# Patient Record
Sex: Male | Born: 1970 | Race: White | Hispanic: No | Marital: Single | Smoking: Current some day smoker
Health system: Southern US, Community
[De-identification: ages and names within clinical notes are randomized; demographics above are authoritative.]

## PROBLEM LIST (undated history)

## (undated) DIAGNOSIS — F329 Major depressive disorder, single episode, unspecified: Secondary | ICD-10-CM

## (undated) DIAGNOSIS — F32A Depression, unspecified: Secondary | ICD-10-CM

## (undated) DIAGNOSIS — F1010 Alcohol abuse, uncomplicated: Secondary | ICD-10-CM

## (undated) DIAGNOSIS — F1910 Other psychoactive substance abuse, uncomplicated: Secondary | ICD-10-CM

## (undated) DIAGNOSIS — F319 Bipolar disorder, unspecified: Secondary | ICD-10-CM

---

## 2013-04-22 ENCOUNTER — Encounter (HOSPITAL_COMMUNITY): Payer: Self-pay | Admitting: *Deleted

## 2013-04-22 DIAGNOSIS — F319 Bipolar disorder, unspecified: Secondary | ICD-10-CM | POA: Insufficient documentation

## 2013-04-22 DIAGNOSIS — F17200 Nicotine dependence, unspecified, uncomplicated: Secondary | ICD-10-CM | POA: Insufficient documentation

## 2013-04-22 DIAGNOSIS — Z79899 Other long term (current) drug therapy: Secondary | ICD-10-CM | POA: Insufficient documentation

## 2013-04-22 DIAGNOSIS — F1910 Other psychoactive substance abuse, uncomplicated: Secondary | ICD-10-CM | POA: Insufficient documentation

## 2013-04-22 NOTE — ED Notes (Addendum)
Homeless, off psych meds for 4 days, feeling SI without plan to harm self. Pt calm in triage. PT states he went to [facility] on Friday, it was full, camped out, pt describes more depression. Pt admits to taking Aderall from someone. Pt rambles with story

## 2013-04-23 ENCOUNTER — Encounter (HOSPITAL_COMMUNITY): Payer: Self-pay

## 2013-04-23 ENCOUNTER — Emergency Department (HOSPITAL_COMMUNITY)
Admission: EM | Admit: 2013-04-23 | Discharge: 2013-04-23 | Disposition: A | Discharge status: Other Acute Inpatient Hospital | Payer: No Typology Code available for payment source | Attending: Emergency Medicine | Admitting: Emergency Medicine

## 2013-04-23 ENCOUNTER — Encounter (HOSPITAL_COMMUNITY): Payer: Self-pay | Admitting: Medical

## 2013-04-23 ENCOUNTER — Inpatient Hospital Stay (HOSPITAL_COMMUNITY)
Admission: AD | Admit: 2013-04-23 | Discharge: 2013-04-26 | DRG: 885 | Disposition: A | Discharge status: Home / Self Care | Payer: No Typology Code available for payment source | Source: Intra-hospital | Attending: Psychiatry | Admitting: Psychiatry

## 2013-04-23 DIAGNOSIS — Z5987 Material hardship due to limited financial resources, not elsewhere classified: Secondary | ICD-10-CM

## 2013-04-23 DIAGNOSIS — F1410 Cocaine abuse, uncomplicated: Secondary | ICD-10-CM | POA: Diagnosis present

## 2013-04-23 DIAGNOSIS — F3160 Bipolar disorder, current episode mixed, unspecified: Secondary | ICD-10-CM

## 2013-04-23 DIAGNOSIS — F319 Bipolar disorder, unspecified: Principal | ICD-10-CM | POA: Diagnosis present

## 2013-04-23 DIAGNOSIS — F1910 Other psychoactive substance abuse, uncomplicated: Secondary | ICD-10-CM

## 2013-04-23 DIAGNOSIS — Z5900 Homelessness unspecified: Secondary | ICD-10-CM

## 2013-04-23 DIAGNOSIS — Z598 Other problems related to housing and economic circumstances: Secondary | ICD-10-CM

## 2013-04-23 DIAGNOSIS — F3162 Bipolar disorder, current episode mixed, moderate: Secondary | ICD-10-CM | POA: Diagnosis present

## 2013-04-23 DIAGNOSIS — Z91199 Patient's noncompliance with other medical treatment and regimen due to unspecified reason: Secondary | ICD-10-CM

## 2013-04-23 DIAGNOSIS — Z9119 Patient's noncompliance with other medical treatment and regimen: Secondary | ICD-10-CM

## 2013-04-23 DIAGNOSIS — R45851 Suicidal ideations: Secondary | ICD-10-CM

## 2013-04-23 HISTORY — DX: Depression, unspecified: F32.A

## 2013-04-23 HISTORY — DX: Bipolar disorder, unspecified: F31.9

## 2013-04-23 HISTORY — DX: Major depressive disorder, single episode, unspecified: F32.9

## 2013-04-23 LAB — CBC WITH DIFFERENTIAL/PLATELET
Basophils Relative: 0 % (ref 0–1)
Eosinophils Absolute: 0.1 10*3/uL (ref 0.0–0.7)
Eosinophils Relative: 1 % (ref 0–5)
MCH: 30.4 pg (ref 26.0–34.0)
MCHC: 34.9 g/dL (ref 30.0–36.0)
Neutrophils Relative %: 62 % (ref 43–77)
Platelets: 246 10*3/uL (ref 150–400)

## 2013-04-23 LAB — ETHANOL: Alcohol, Ethyl (B): <11 mg/dL (ref 0–11)

## 2013-04-23 LAB — RAPID URINE DRUG SCREEN, HOSP PERFORMED
Opiates: NOT DETECTED
Tetrahydrocannabinol: POSITIVE — AB

## 2013-04-23 MED ORDER — MAGNESIUM HYDROXIDE 400 MG/5ML PO SUSP: 30.0000 mL | Freq: Every day | ORAL | Status: DC | PRN

## 2013-04-23 MED ORDER — ACETAMINOPHEN 325 MG PO TABS: 650.0000 mg | ORAL_TABLET | Freq: Four times a day (QID) | ORAL | Status: DC | PRN

## 2013-04-23 MED ORDER — ALUM & MAG HYDROXIDE-SIMETH 200-200-20 MG/5ML PO SUSP: 30.0000 mL | ORAL | Status: DC | PRN

## 2013-04-23 MED ORDER — HYDROXYZINE HCL 50 MG PO TABS: 50.0000 mg | ORAL_TABLET | Freq: Every evening | ORAL | Status: DC | PRN

## 2013-04-23 MED ORDER — PNEUMOCOCCAL VAC POLYVALENT 25 MCG/0.5ML IJ INJ
0.5000 mL | INJECTION | INTRAMUSCULAR | Status: AC
  Administered 2013-04-24: 0.5 mL via INTRAMUSCULAR

## 2013-04-23 MED ORDER — HYDROXYZINE HCL 50 MG PO TABS
50.0000 mg | ORAL_TABLET | Freq: Every evening | ORAL | Status: DC | PRN
  Administered 2013-04-23 – 2013-04-25 (×3): 50 mg via ORAL
  Filled 2013-04-23 (×2): qty 1
  Filled 2013-04-23: qty 28
  Filled 2013-04-23: qty 1
  Filled 2013-04-23: qty 28
  Filled 2013-04-23 (×6): qty 1

## 2013-04-23 NOTE — BH Assessment (Signed)
Assessment Note   Cody Osborne is an 42 y.o. male. Pt presents voluntarily to [facility] with SI and having been off his psych meds since his arrival from [facility] to [place] one week ago. Pt has been inpatient previously: [employer], [facility], [address], in [place], [facility] - for bipolar disorder and crack cocaine abuse. Pt states he hasn't used crack since Feb 2014. He was taking Effexor 150 mg. Pt reports risky behavior including selling his vehicles for drugs, quitting jobs suddenly.  Pt states he thinks about killing himself by drowning like his grandmother did in 51. Pt unable to contract for safety.  Pt cooperative, hyperverbal. He denies [facility] and denies HI. No delusions noted.  He reports depressed mood with irritability.   Axis I: Bipolar I Disorder Axis II: Deferred Axis III:  Past Medical History  Diagnosis Date  . Bipolar 1 disorder   . Depression    Axis IV: economic problems, housing problems, occupational problems, other psychosocial or environmental problems, problems related to social environment and problems with primary support group Axis V: 31-40 impairment in reality testing  Past Medical History:  Past Medical History  Diagnosis Date  . Bipolar 1 disorder   . Depression     History reviewed. No pertinent past surgical history.  Family History: No family history on file.  Social History:  reports that he has been smoking.  He does not have any smokeless tobacco history on file. He reports that  drinks alcohol. He reports that he uses illicit drugs (Marijuana).  Additional Social History:  Alcohol / Drug Use Pain Medications: none Prescriptions: see PTA meds - hasn't taken Effexor in one week Over the Counter: none History of alcohol / drug use?: Yes Negative Consequences of Use: Financial;Work / School Substance #1 Name of Substance 1: crack cocaine 1 - Amount (size/oz): varied depending on what he could afford 1 - Frequency: varied 1 -  Duration: on and off for years 1 - Last Use / Amount: Feb 2014  CIWA: CIWA-Ar BP: 153/114 mmHg Pulse Rate: 102 COWS:    Allergies: No Known Allergies  Home Medications:  (Not in a hospital admission)  OB/GYN Status:  No LMP for male patient.  General Assessment Data Location of Assessment: [facility] Living Arrangements: Other (Comment) (homeless) Can pt return to current living arrangement?: Yes Admission Status: Voluntary Is patient capable of signing voluntary admission?: Yes Transfer from: Acute Hospital Referral Source: Self/Family/Friend  Education Status Is patient currently in school?: No  Risk to self Suicidal Ideation: Yes-Currently Present Suicidal Intent: No Is patient at risk for suicide?: Yes Suicidal Plan?:  (think of drowning himself) Access to Means: Yes Specify Access to Suicidal Means: bodies of water What has been your use of drugs/alcohol within the last 12 months?: occasional crack use Previous Attempts/Gestures: No How many times?: 0 Other Self Harm Risks: none Triggers for Past Attempts:  (n/a) Intentional Self Injurious Behavior: None Family Suicide History: Yes (grandmother drownedherself in 58) Recent stressful life event(s): Other (Comment) (homelessness, moved from [place]) Persecutory voices/beliefs?: No Depression: No Depression Symptoms: Feeling angry/irritable Substance abuse history and/or treatment for substance abuse?: Yes Suicide prevention information given to non-admitted patients: Not applicable  Risk to Others Homicidal Ideation: No Thoughts of Harm to Others: No Current Homicidal Intent: No Current Homicidal Plan: No Access to Homicidal Means: No Identified Victim: none History of harm to others?: No Assessment of Violence: None Noted Violent Behavior Description: pt calm  Does patient have access to weapons?:  No Criminal Charges Pending?: No Does patient have a court date: No  Psychosis Hallucinations: None  noted Delusions: None noted  Mental Status Report Appear/Hygiene: Other (Comment) (approrpiate) Eye Contact: Good Motor Activity: Freedom of movement Speech: Logical/coherent;Rapid Level of Consciousness: Alert Mood: Depressed Affect: Appropriate to circumstance;Depressed Anxiety Level: None Thought Processes: Coherent;Relevant Judgement: Unimpaired Orientation: Person;Place;Time;Situation Obsessive Compulsive Thoughts/Behaviors: None  Cognitive Functioning Concentration: Normal Memory: Recent Intact;Remote Intact IQ: Average Insight: Fair Impulse Control: Poor Appetite: Good Weight Loss: 0 Weight Gain: 0 Sleep: Decreased Total Hours of Sleep: 4 (homeless so difficult to sleep outside) Vegetative Symptoms: None  ADLScreening [facility] Assessment Services) Patient's cognitive ability adequate to safely complete daily activities?: Yes Patient able to express need for assistance with ADLs?: Yes Independently performs ADLs?: Yes (appropriate for developmental age)  Abuse/Neglect [facility] Physical Abuse: Denies Verbal Abuse: Denies Sexual Abuse: Denies  Prior Inpatient Therapy Prior Inpatient Therapy: Yes Prior Therapy Dates: over several years Prior Therapy Facilty/Provider(s): in [facility], [facility], [facility], in [facility], [facility] Reason for Treatment: bipolar disorder and crack cocaine abuse  Prior Outpatient Therapy Prior Outpatient Therapy: Yes Prior Therapy Dates: until last week Prior Therapy Facilty/Provider(s): at [facility] Reason for Treatment: med management  ADL Screening (condition at time of admission) Patient's cognitive ability adequate to safely complete daily activities?: Yes Is the patient deaf or have difficulty hearing?: No Does the patient have difficulty seeing, even when wearing glasses/contacts?: No Does the patient have difficulty concentrating, remembering, or making decisions?: No Patient able to express need for assistance with  ADLs?: Yes Does the patient have difficulty dressing or bathing?: No Independently performs ADLs?: Yes (appropriate for developmental age) Does the patient have difficulty walking or climbing stairs?: No Weakness of Legs: None Weakness of Arms/Hands: None       Abuse/Neglect Assessment (Assessment to be complete while patient is alone) Physical Abuse: Denies Verbal Abuse: Denies Sexual Abuse: Denies Exploitation of patient/patient's resources: Denies Self-Neglect: Denies Values / Beliefs Cultural Requests During Hospitalization: None Spiritual Requests During Hospitalization: None   Advance Directives (For Healthcare) Advance Directive: Patient does not have advance directive;Patient would not like information    Additional Information 1:1 In Past 12 Months?: No CIRT Risk: No Elopement Risk: No Does patient have medical clearance?: Yes     Disposition:  Disposition Initial Assessment Completed for this Encounter: Yes Disposition of Patient: Inpatient treatment program;Outpatient treatment  On Site Evaluation by:   Reviewed with Physician:     Donnamarie Rossetti P 04/23/2013 4:08 AM

## 2013-04-23 NOTE — Progress Notes (Signed)
Patient ID: Cody Osborne, male   DOB: [birth year], 42 y.o.   MRN: [id number]  Admission Note:  D:42 yr male who presents [facility] in no acute distress for the treatment of SI and Depression. Pt appears flat and depressed. Pt was calm and cooperative with admission process.Pt denies SI/HI/AVH/Pain at this time. Pt states he was on way to [facility] in [place], but it was full, so pt stayed in tents with friend, but pt was feeling suicidal and having increases depression, so pt was brought to [facility].  A:Skin was assessed and found to be clear of any abnormal marks apart from tattoos L-shoulder, R-leg, both arms. POC and unit policies explained and understanding verbalized. Consents obtained. Food and fluids offered, and Accepted.  R: Pt had no additional questions or concerns at this time.

## 2013-04-23 NOTE — Consult Note (Signed)
Reason for Consult:ED admission for Bipolar do off medications-left previous treatment/support system in / [facility] 5 days ago without meds.Staying in woods with other homeless people one of whom provide him with adderall past 5 days.Says if he has to live out in woods he "will have to do something drastic" in response to question about suicide Referring Physician: ED Providers  Cody Osborne is an 42 y.o. male.  HPI: see ED admit note and Reason For Consultation  Past Medical History  Diagnosis Date  . Bipolar 1 disorder   . Depression     History reviewed. No pertinent past surgical history.  No family history on file.  Social History:  reports that he has been smoking.  He does not have any smokeless tobacco history on file. He reports that  drinks alcohol. He reports that he uses illicit drugs (Marijuana).  Allergies: No Known Allergies  Medications: I have reviewed the patient's current medications.  Results for orders placed during the hospital encounter of 04/23/13 (from the past 48 hour(s))  URINE RAPID DRUG SCREEN (HOSP PERFORMED)     Status: Abnormal   Collection Time    04/23/13 12:40 AM      Result Value Range   Opiates NONE DETECTED  NONE DETECTED   Cocaine NONE DETECTED  NONE DETECTED   Benzodiazepines NONE DETECTED  NONE DETECTED   Amphetamines POSITIVE (*) NONE DETECTED   Tetrahydrocannabinol POSITIVE (*) NONE DETECTED   Barbiturates NONE DETECTED  NONE DETECTED   Comment:            DRUG SCREEN FOR MEDICAL PURPOSES     ONLY.  IF CONFIRMATION IS NEEDED     FOR ANY PURPOSE, NOTIFY LAB     WITHIN 5 DAYS.                LOWEST DETECTABLE LIMITS     FOR URINE DRUG SCREEN     Drug Class       Cutoff (ng/mL)     Amphetamine      1000     Barbiturate      200     Benzodiazepine   200     Tricyclics       300     Opiates          300     Cocaine          300     THC              50  CBC WITH DIFFERENTIAL     Status: Abnormal   Collection Time   04/23/13  1:05 AM      Result Value Range   WBC 12.6 (*) 4.0 - 10.5 K/uL   RBC 5.26  4.22 - 5.81 MIL/uL   Hemoglobin 16.0  13.0 - 17.0 g/dL   HCT 62.1  30.8 - 65.7 %   MCV 87.3  78.0 - 100.0 fL   MCH 30.4  26.0 - 34.0 pg   MCHC 34.9  30.0 - 36.0 g/dL   RDW 84.6  96.2 - 95.2 %   Platelets 246  150 - 400 K/uL   Neutrophils Relative % 62  43 - 77 %   Neutro Abs 7.8 (*) 1.7 - 7.7 K/uL   Lymphocytes Relative 30  12 - 46 %   Lymphs Abs 3.8  0.7 - 4.0 K/uL   Monocytes Relative 7  3 - 12 %   Monocytes Absolute 0.9  0.1 - 1.0  K/uL   Eosinophils Relative 1  0 - 5 %   Eosinophils Absolute 0.1  0.0 - 0.7 K/uL   Basophils Relative 0  0 - 1 %   Basophils Absolute 0.0  0.0 - 0.1 K/uL  ETHANOL     Status: None   Collection Time    04/23/13  1:05 AM      Result Value Range   Alcohol, Ethyl (B) <11  0 - 11 mg/dL   Comment:            LOWEST DETECTABLE LIMIT FOR     SERUM ALCOHOL IS 11 mg/dL     FOR MEDICAL PURPOSES ONLY    No results found.  Review of Systems  Constitutional: Negative.  Negative for fever, chills and weight loss.  HENT: Negative.  Negative for hearing loss, ear pain, nosebleeds, neck pain, tinnitus and ear discharge.   Eyes: Negative.  Negative for blurred vision, double vision, photophobia, pain, discharge and redness.  Respiratory: Negative.  Negative for cough, hemoptysis, sputum production, shortness of breath and wheezing.   Cardiovascular: Negative.  Negative for chest pain, palpitations, orthopnea, claudication, leg swelling and PND.  Gastrointestinal: Negative.  Negative for heartburn, nausea, vomiting, diarrhea and constipation.  Genitourinary: Negative.  Negative for dysuria, urgency, frequency, hematuria and flank pain.  Musculoskeletal: Negative.  Negative for myalgias, back pain, joint pain and falls.  Skin: Positive for rash. Negative for itching.  Neurological: Negative.  Negative for dizziness, tingling, tremors, sensory change, speech change, focal  weakness, seizures, loss of consciousness and headaches.  Endo/Heme/Allergies: Negative.  Negative for environmental allergies and polydipsia. Does not bruise/bleed easily.  Psychiatric/Behavioral: Positive for depression, suicidal ideas and substance abuse (amphetamines most recently-2 rehabs 2002 and 2009 for crack cocaine). Negative for hallucinations and memory loss. The patient has insomnia. The patient is not nervous/anxious.        Takes effexor 150 mg irregularly   Blood pressure 153/114, pulse 102, temperature 98.3 F (36.8 C), temperature source Oral, resp. rate 20, SpO2 100.00%. Physical Exam  Nursing note and vitals reviewed. Constitutional: He is oriented to person, place, and time. He appears well-developed and well-nourished.  HENT:  Head: Normocephalic and atraumatic.  Right Ear: External ear normal.  Left Ear: External ear normal.  Nose: Nose normal.  Eyes: Conjunctivae and EOM are normal. Pupils are equal, round, and reactive to light. Right eye exhibits no discharge. Left eye exhibits no discharge. No scleral icterus.  Neck: Normal range of motion. Neck supple. No JVD present. No tracheal deviation present.  Cardiovascular: Normal rate and regular rhythm.   Respiratory: Effort normal and breath sounds normal.  GI:  Deferred  Genitourinary:  Deferred  Musculoskeletal: Normal range of motion.  Neurological: He is alert and oriented to person, place, and time. He has normal reflexes.  Psychiatric: His affect is labile. His speech is rapid and/or pressured. He is hyperactive. Thought content is not paranoid and not delusional. He expresses impulsivity and inappropriate judgment. He expresses suicidal ideation. He expresses no homicidal ideation. He expresses suicidal plans (jump in pool and drown). He expresses no homicidal plans. He exhibits abnormal recent memory and abnormal remote memory.    Assessment/Plan  A-    AXIS I-Bipolar mixed moderate episode                     Noncompliant with Meds                    Polysubstance  abuse-THC/Crack Cocaine/Amphetamines                    Nicotene Dependence         AXIS II- Deferred         AXIS III- Elevated WBC-? Etiology         Axis IV- No family/social support sytem ; homeless         Axis V- 40-50  Plan- Admit to [facility] after WBC elevation addressed and treated  Cody Osborne 04/23/2013, 3:57 AM

## 2013-04-23 NOTE — ED Provider Notes (Signed)
Medical screening examination/treatment/procedure(s) were performed by non-physician practitioner and as supervising physician I was immediately available for consultation/collaboration.  Jones Skene, M.D.     Jones Skene, MD 04/23/13 1113

## 2013-04-23 NOTE — ED Notes (Signed)
Placed called to [facility]. They are a shelter only they do not keep medical charts.

## 2013-04-23 NOTE — BHH Counselor (Signed)
Patient accepted to [facility] by Leonette Most, PA to Dr. Elsie Saas. The room assignment is 507-2. The nursing report # is [phone].

## 2013-04-23 NOTE — Progress Notes (Signed)
Patient ID: Cody Osborne, male   DOB: [birth year], 42 y.o.   MRN: [id number]  D: Pt denies SI/HI/AVH/pain. Pt is pleasant and cooperative. Pt states he felt better after he ate earlier.   A: Pt was offered support and encouragement. Pt was given scheduled medications. Pt was encourage to attend groups. Q 15 minute checks were done for safety.   R:Pt attends groups and interacts well with peers and staff. Pt is taking medication. Pt has no complaints at this time.Pt receptive to treatment and safety maintained on unit.

## 2013-04-23 NOTE — ED Notes (Signed)
Pt has a green and yellow book bag and one pt belonging bag. Pt has only clothes/shoes. Pt does not have a phone or any electronic devices with him.

## 2013-04-23 NOTE — Tx Team (Signed)
Initial Interdisciplinary Treatment Plan  PATIENT STRENGTHS: (choose at least two) General fund of knowledge  PATIENT STRESSORS: Financial difficulties Health problems Medication change or noncompliance   PROBLEM LIST: Problem List/Patient Goals Date to be addressed Date deferred Reason deferred Estimated date of resolution  Depression      Risk for Suicide                                                 DISCHARGE CRITERIA:  Adequate post-discharge living arrangements Improved stabilization in mood, thinking, and/or behavior  PRELIMINARY DISCHARGE PLAN: Attend aftercare/continuing care group Outpatient therapy  PATIENT/FAMIILY INVOLVEMENT: This treatment plan has been presented to and reviewed with the patient, Cody Osborne.  The patient and family have been given the opportunity to ask questions and make suggestions.  Jacques Navy A 04/23/2013, 6:06 PM

## 2013-04-23 NOTE — ED Notes (Signed)
Called report to [facility]. Lupita Leash informed me that the bed pt was supposed to go to is still occupied at this time. Lupita Leash said she will call me when an available bed comes open at [facility].

## 2013-04-23 NOTE — ED Notes (Signed)
Personal items and valuable policy reviewed with patient. Patient understands.

## 2013-04-23 NOTE — Progress Notes (Signed)
Adult Psychoeducational Group Note  Date:  04/23/2013 Time:  8:00PM Group Topic/Focus:  Wrap-Up Group:   The focus of this group is to help patients review their daily goal of treatment and discuss progress on daily workbooks.  Participation Level:  Active  Participation Quality:  Appropriate and Attentive  Affect:  Appropriate  Cognitive:  Alert and Appropriate  Insight: Appropriate  Engagement in Group:  Engaged  Modes of Intervention:  Discussion  Additional Comments:  Pt. Was attentive and appropriate during tonight's group discussion. Pt shared with group on how he has been restless and tearful. Pt shared that he have been having thoughts of hurting hisself and that he is now ready for the much needed help. Pt stated that he need to get back on his medication and take better care of hisself.   [name] D 04/23/2013, 9:50 PM

## 2013-04-23 NOTE — ED Provider Notes (Signed)
   History    CSN: [id number] Arrival date & time 04/22/13  2059  First MD Initiated Contact with Patient 04/23/13 0014     Chief Complaint  Patient presents with  . Medical clearance    (Consider location/radiation/quality/duration/timing/severity/associated sxs/prior Treatment) HPI Comments: Patient with Bipolar off meds since Friday (was only on meds for 2 weeks this time) now homeless, scattered thought racing, putting gun to head, or swimming into a hole but than states he would not really do that.   The history is provided by the patient.   Past Medical History  Diagnosis Date  . Bipolar 1 disorder   . Depression    History reviewed. No pertinent past surgical history. No family history on file. History  Substance Use Topics  . Smoking status: Current Every Day Smoker  . Smokeless tobacco: Not on file  . Alcohol Use: Yes     Comment: occ    Review of Systems  Constitutional: Negative for fever and chills.  Respiratory: Negative for shortness of breath.   Cardiovascular: Negative for chest pain and leg swelling.  Genitourinary: Negative for dysuria.  Musculoskeletal: Negative for back pain and gait problem.  Skin: Negative for rash.  Neurological: Negative for dizziness and headaches.  All other systems reviewed and are negative.    Allergies  Review of patient's allergies indicates no known allergies.  Home Medications   Current Outpatient Rx  Name  Route  Sig  Dispense  Refill  . venlafaxine XR (EFFEXOR-XR) 150 MG 24 hr capsule   Oral   Take 150 mg by mouth daily.          BP 153/114  Pulse 102  Temp(Src) 98.3 F (36.8 C) (Oral)  Resp 20  SpO2 100% Physical Exam  Constitutional: He appears well-developed and well-nourished.  HENT:  Head: Atraumatic.  Eyes: Pupils are equal, round, and reactive to light.  Neck: Normal range of motion.  Cardiovascular: Normal rate and regular rhythm.   Pulmonary/Chest: Effort normal.  Abdominal: Soft.   Musculoskeletal: Normal range of motion.  Neurological: He is alert.  Skin: Skin is warm and dry. No rash noted. No erythema.  Psychiatric: His affect is labile. His speech is tangential. He is agitated. Cognition and memory are normal. He expresses impulsivity.  Patient will be speaking normally, although deep tangentially, and then suddenly became hostile, beating his head on the bed.  Over-shoulder, pointing his finger at his head in a burn-like fashion and pulling the trigger in stating, I would never do that    ED Course  Procedures (including critical care time) Labs Reviewed  CBC WITH DIFFERENTIAL - Abnormal; Notable for the following:    WBC 12.6 (*)    Neutro Abs 7.8 (*)    All other components within normal limits  URINE RAPID DRUG SCREEN (HOSP PERFORMED) - Abnormal; Notable for the following:    Amphetamines POSITIVE (*)    Tetrahydrocannabinol POSITIVE (*)    All other components within normal limits  ETHANOL   No results found. No diagnosis found.  MDM  Patient is scattered can't keep a trend of thought.  Orlene Erm is all over the place Patient's labs reviewed.  He is cleared for psych evaluation   Arman Filter, NP 04/23/13 [phone]

## 2013-04-24 ENCOUNTER — Encounter (HOSPITAL_COMMUNITY): Payer: Self-pay | Admitting: Psychiatry

## 2013-04-24 MED ORDER — RISPERIDONE 0.5 MG PO TABS
0.5000 mg | ORAL_TABLET | Freq: Every day | ORAL | Status: DC
  Administered 2013-04-24 – 2013-04-25 (×2): 0.5 mg via ORAL
  Filled 2013-04-24 (×4): qty 1

## 2013-04-24 MED ORDER — VENLAFAXINE HCL 37.5 MG PO TABS
37.5000 mg | ORAL_TABLET | Freq: Every day | ORAL | Status: DC
  Administered 2013-04-25 – 2013-04-26 (×2): 37.5 mg via ORAL
  Filled 2013-04-24 (×4): qty 1

## 2013-04-24 MED ORDER — NICOTINE 14 MG/24HR TD PT24
14.0000 mg | MEDICATED_PATCH | Freq: Every day | TRANSDERMAL | Status: DC
  Administered 2013-04-24 – 2013-04-26 (×3): 14 mg via TRANSDERMAL
  Filled 2013-04-24 (×5): qty 1

## 2013-04-24 MED ORDER — VENLAFAXINE HCL 75 MG PO TABS: 75.0000 mg | ORAL_TABLET | Freq: Every day | ORAL | Status: DC

## 2013-04-24 NOTE — H&P (Signed)
Psychiatric Admission Assessment Adult  Patient Identification:  Cody Osborne Date of Evaluation:  04/24/2013 Chief Complaint:  BIPOLAR I DISORDER POLYSUBSTANCE ABUSE History of Present Illness: Cody Osborne is an 42 y.o. male. Pt presents voluntarily to [facility] with SI and having been off his psych meds since his arrival from [facility] to [place] one week ago. Pt has been inpatient previously: [employer], [facility], [address], in [place], [employer] - for bipolar disorder and crack cocaine abuse. Pt states he hasn't used crack since Feb 2014. Today patient states "I've been abusing substances for so long it's been hard for me to get an accurate mental health diagnosis." Patient talks about being homeless since July 2012 because "When I drink and use drugs I call out from work and stuff so I end up losing jobs. I've been all around at shelters, staying with friends, hanging with people who stay in tents. My family is old and don't care about me. I might as well put a bullet in my brain or drown myself like my grandmother did." He reports not having taken Effexor since 2000. The patient talks about wanting to get a vehicle and a stable living situation stating "I have filled out the paperwork for [employer]. I'm 42 years old and I need to get it together." He denies any legal charges or drinking daily.   Elements:  Location:  [facility] in-patient. Quality:  Depression with SI. Severity:  Severe. Timing:  Last few months. Duration:  Since 2012. Context:  medication noncompliance, homelessness. Associated Signs/Synptoms: Depression Symptoms:  depressed mood, anhedonia, feelings of worthlessness/guilt, difficulty concentrating, hopelessness, suicidal thoughts with specific plan, anxiety, loss of energy/fatigue, disturbed sleep, (Hypo) Manic Symptoms:  Distractibility, Flight of Ideas, Impulsivity, Labiality of Mood, Anxiety Symptoms:  Excessive Worry, Psychotic Symptoms:   Denies PTSD Symptoms: Denies  Psychiatric Specialty Exam: Physical Exam-Findings from the ED reviewed  Review of Systems  Constitutional: Negative.  Negative for fever, chills, weight loss and malaise/fatigue.  HENT: Negative.  Negative for hearing loss, ear pain, nosebleeds, neck pain and ear discharge.   Eyes: Negative.  Negative for blurred vision, double vision and photophobia.  Respiratory: Negative.  Negative for cough, hemoptysis and sputum production.   Cardiovascular: Negative.  Negative for chest pain, palpitations, orthopnea, claudication and leg swelling.  Gastrointestinal: Negative.  Negative for heartburn, nausea, vomiting, abdominal pain and diarrhea.  Genitourinary: Negative.  Negative for dysuria and urgency.  Musculoskeletal: Negative.  Negative for myalgias and joint pain.  Skin: Negative.  Negative for itching and rash.  Neurological: Negative.  Negative for dizziness, tingling, tremors, sensory change and headaches.  Endo/Heme/Allergies: Negative.  Negative for environmental allergies. Does not bruise/bleed easily.  Psychiatric/Behavioral: Positive for depression, suicidal ideas and substance abuse. Negative for hallucinations and memory loss. The patient is nervous/anxious and has insomnia.     Blood pressure 114/77, pulse 80, temperature 97.9 F (36.6 C), temperature source Oral, resp. rate 20, height 5' 9.5" (1.765 m), weight 103.874 kg (229 lb).Body mass index is 33.34 kg/(m^2).  General Appearance: Casual and Disheveled  Eye Contact::  Good  Speech:  Rapid  Volume:  Normal  Mood:  Anxious, Dysphoric and Hopeless  Affect:  Congruent  Thought Process:  Tangential  Orientation:  Full (Time, Place, and Person)  Thought Content:  Rumination  Suicidal Thoughts:  Yes.  with intent/plan  Homicidal Thoughts:  No  Memory:  Immediate;   Good Recent;   Good Remote;   Good  Judgement:  Impaired  Insight:  Shallow  Psychomotor Activity:  Restlessness  Concentration:   Fair  Recall:  Good  Akathisia:  No  Handed:  Right  AIMS (if indicated):     Assets:  Communication Skills Desire for Improvement Leisure Time Physical Health Resilience Talents/Skills  Sleep:  Number of Hours: 5.75    Past Psychiatric History:Yes Diagnosis: Bipolar 1  Hospitalizations:[facility] and multiple other places  Outpatient Care:Denies  Substance Abuse Care: [facility] for crack abuse  Self-Mutilation:Denies  Suicidal Attempts:Denies  Violent Behaviors:Denies   Past Medical History:   Past Medical History  Diagnosis Date  . Bipolar 1 disorder   . Depression    None. Allergies:  No Known Allergies PTA Medications: Prescriptions prior to admission  Medication Sig Dispense Refill  . venlafaxine XR (EFFEXOR-XR) 150 MG 24 hr capsule Take 150 mg by mouth daily.        Previous Psychotropic Medications:  Medication/Dose  Lithium-"Got tired of the blood tests"  Risperdal-"Sedated me"  Paxil-"Sexual side effects"           Substance Abuse History in the last 12 months:  yes  Consequences of Substance Abuse: Family Consequences:  Patient reports long history of substance abuse and is estranged from his family.   Social History:  reports that he has been smoking Cigarettes.  He has a 4 pack-year smoking history. He does not have any smokeless tobacco history on file. He reports that  drinks alcohol. He reports that he uses illicit drugs (Marijuana). Additional Social History:                      Current Place of Residence:   Place of Birth:   Family Members: Marital Status:  Single Children:  Sons:  Daughters: Relationships: Education:  Corporate treasurer Problems/Performance: Religious Beliefs/Practices: History of Abuse (Emotional/Phsycial/Sexual) Teacher, music History:  None. Legal History: Hobbies/Interests:  Family History:  History reviewed. No pertinent family history.  Results for orders placed during the  hospital encounter of 04/23/13 (from the past 72 hour(s))  URINE RAPID DRUG SCREEN (HOSP PERFORMED)     Status: Abnormal   Collection Time    04/23/13 12:40 AM      Result Value Range   Opiates NONE DETECTED  NONE DETECTED   Cocaine NONE DETECTED  NONE DETECTED   Benzodiazepines NONE DETECTED  NONE DETECTED   Amphetamines POSITIVE (*) NONE DETECTED   Tetrahydrocannabinol POSITIVE (*) NONE DETECTED   Barbiturates NONE DETECTED  NONE DETECTED   Comment:            DRUG SCREEN FOR MEDICAL PURPOSES     ONLY.  IF CONFIRMATION IS NEEDED     FOR ANY PURPOSE, NOTIFY LAB     WITHIN 5 DAYS.                LOWEST DETECTABLE LIMITS     FOR URINE DRUG SCREEN     Drug Class       Cutoff (ng/mL)     Amphetamine      1000     Barbiturate      200     Benzodiazepine   200     Tricyclics       300     Opiates          300     Cocaine          300     THC  50  CBC WITH DIFFERENTIAL     Status: Abnormal   Collection Time    04/23/13  1:05 AM      Result Value Range   WBC 12.6 (*) 4.0 - 10.5 K/uL   RBC 5.26  4.22 - 5.81 MIL/uL   Hemoglobin 16.0  13.0 - 17.0 g/dL   HCT 11.9  14.7 - 82.9 %   MCV 87.3  78.0 - 100.0 fL   MCH 30.4  26.0 - 34.0 pg   MCHC 34.9  30.0 - 36.0 g/dL   RDW 56.2  13.0 - 86.5 %   Platelets 246  150 - 400 K/uL   Neutrophils Relative % 62  43 - 77 %   Neutro Abs 7.8 (*) 1.7 - 7.7 K/uL   Lymphocytes Relative 30  12 - 46 %   Lymphs Abs 3.8  0.7 - 4.0 K/uL   Monocytes Relative 7  3 - 12 %   Monocytes Absolute 0.9  0.1 - 1.0 K/uL   Eosinophils Relative 1  0 - 5 %   Eosinophils Absolute 0.1  0.0 - 0.7 K/uL   Basophils Relative 0  0 - 1 %   Basophils Absolute 0.0  0.0 - 0.1 K/uL  ETHANOL     Status: None   Collection Time    04/23/13  1:05 AM      Result Value Range   Alcohol, Ethyl (B) <11  0 - 11 mg/dL   Comment:            LOWEST DETECTABLE LIMIT FOR     SERUM ALCOHOL IS 11 mg/dL     FOR MEDICAL PURPOSES ONLY   Psychological Evaluations:  Assessment:    AXIS I:  Bipolar, Depressed AXIS II:  Deferred AXIS III:   Past Medical History  Diagnosis Date  . Bipolar 1 disorder   . Depression    AXIS IV:  economic problems, housing problems, occupational problems, other psychosocial or environmental problems and problems with primary support group AXIS V:  41-50 serious symptoms   Treatment Plan/Recommendations:   1. Admit for crisis management and stabilization. Estimated length of stay 5-7 days. 2. Medication management to reduce current symptoms to base line and improve the patient's level of functioning. Started on Effexor 37.5  mg po daily for depressive and anxious symptoms. Started on Risperdal 0.5 mg at hs to improve stability of mood. Vistaril 50 mg initiated to help improve sleep. 3. Develop treatment plan to decrease risk of relapse upon discharge of depressive symptoms and the need for readmission. 5. Group therapy to facilitate development of healthy coping skills to use for depression and anxiety. 6. Health care follow up as needed for medical problems.  7. Discharge plan to include therapy to help patient cope with stressors.  8. Call for Consult with Hospitalist for additional specialty patient services as needed.   Treatment Plan Summary: Daily contact with patient to assess and evaluate symptoms and progress in treatment Medication management Supportive approach/coping skills/relapse prevention/identify detox needs/reassess and address the co morbidities Current Medications:  Current Facility-Administered Medications  Medication Dose Route Frequency Provider Last Rate Last Dose  . acetaminophen (TYLENOL) tablet 650 mg  650 mg Oral Q6H PRN [name], NP      . alum & mag hydroxide-simeth (MAALOX/MYLANTA) 200-200-20 MG/5ML suspension 30 mL  30 mL Oral Q4H PRN [name], NP      . hydrOXYzine (ATARAX/VISTARIL) tablet 50 mg  50 mg Oral QHS,MR X 1  Kerry Hough, PA-C   50 mg at 04/23/13 2238  . magnesium  hydroxide (MILK OF MAGNESIA) suspension 30 mL  30 mL Oral Daily PRN [name], NP        Observation Level/Precautions:  15 minute checks  Laboratory:  CBC Chemistry Profile UDS  Psychotherapy:  Group Sessions  Medications:  See list  Consultations:  As needed  Discharge Concerns:  Safety and Stability  Estimated LOS: 5-7 days  Other:     I certify that inpatient services furnished can reasonably be expected to improve the patient's condition.   [name] NP-C 7/15/201411:57 AM  Agree with assessment and plan Madie Reno A. Dub Mikes, M.D.

## 2013-04-24 NOTE — [facility] Group Notes (Signed)
[facility] LCSW Group Therapy      Feelings About Diagnosis 1:15 - 2:30 PM         04/24/2013  12:26 PM    Type of Therapy:  Group Therapy  Participation Level:  Active  Participation Quality:  Appropriate  Affect:  Appropriate  Cognitive:  Alert and Appropriate  Insight:  Developing/Improving and Engaged  Engagement in Therapy:  Developing/Improving and Engaged  Modes of Intervention:  Discussion, Education, Exploration, Problem-Solving, Rapport Building, Support  Summary of Progress/Problems:  Patient actively participated in group. Patient discussed past and present diagnosis and the effects it has had on  life.  Patient talked about family and society being judgmental and the stigma associated with having a mental health diagnosis.  He shared Bipolar Disorder feels like a curse to him.  He stated he has hit rock bottom by being off medication, suicidal and being homeless.  He shared he looks forward to getting his life back on track.  Wynn Banker 04/24/2013  12:26 PM

## 2013-04-24 NOTE — B[facility] Counselor (Signed)
Adult Comprehensive Assessment  Patient ID: Cody Osborne, male   DOB: 1971/08/07, 42 y.o.   MRN: [id number]  Information Source: Information source: Patient  Current Stressors:  Educational / Learning stressors: None Employment / Job issues: None - patient is unemployed Family Relationships: Limited contact with family but reports mother is Surveyor, mining / Lack of resources (include bankruptcy): Yes, difficulty due to being unemployed Housing / Lack of housing: Patient is currently homeless Physical health (include injuries & life threatening diseases): None Social relationships: None Substance abuse: Patient reports drinking six to eight beers several times weekly Bereavement / Loss: None recently but brother died in Feb 26, 2011  Living/Environment/Situation:  Living Arrangements: Other (Comment) (homeless) Living conditions (as described by patient or guardian): Homeless How long has patient lived in current situation?: Off and on for the past few years What is atmosphere in current home: Other (Comment)  Family History:  Marital status: Single Does patient have children?: No  Childhood History:  By whom was/is the patient raised?: Both parents Additional childhood history information: Very good childhood Description of patient's relationship with caregiver when they were a child: Good family relationships growing up Patient's description of current relationship with people who raised him/her: Okay with mother - father is deceased Does patient have siblings?: Yes Number of Siblings: 1 Description of patient's current relationship with siblings: Okay Did patient suffer any verbal/emotional/physical/sexual abuse as a child?: No Did patient suffer from severe childhood neglect?: No Has patient ever been sexually abused/assaulted/raped as an adolescent or adult?: No Witnessed domestic violence?: No Has patient been effected by domestic violence as an adult?: No  Education:   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  Currently a Consulting civil engineer?: No Learning disability?: No  Employment/Work Situation:   Employment situation: Unemployed Patient's job has been impacted by current illness: No What is the longest time patient has a held a job?: Two years 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, [employer] Has patient ever been in the [employer]?: No Has patient ever served in Buyer, retail?: No  Financial Resources:   Financial resources: No income Does patient have a Lawyer or guardian?: No  Alcohol/Substance Abuse:   What has been your use of drugs/alcohol within the last 12 months?: Patient reports drinking six to eight beers several times per week Alcohol/Substance Abuse Treatment Hx: Past Tx, Inpatient If yes, describe treatment: Patient reports having been at [facility] in 2011/02/26 Has alcohol/substance abuse ever caused legal problems?: Yes (Two DWI one in 26-Feb-1992 and another in 26-Feb-1999)  Social Support System:   Patient's Community Support System: Fair Museum/gallery exhibitions officer System: Patient reports having some involvment with AA Type of faith/religion: Ephriam Knuckles How does patient's faith help to cope with current illness?: Chief Operating Officer:   Leisure and Hobbies: Chartered certified accountant  Strengths/Needs:   What things does the patient do well?: Good communication skills - able to present himself well In what areas does patient struggle / problems for patient: Being a good human being  Discharge Plan:   Does patient have access to transportation?: No Plan for no access to transportation at discharge: Paitent uses public transportation Will patient be returning to same living situation after discharge?: No (Patient uncertain where he will live at discharge) Currently receiving community mental health services: No If no, would patient like referral for services when discharged?: Yes (What county?) ([place]) Does  patient have financial barriers related to discharge medications?: Yes Patient description of barriers related to discharge  medications: Patient has no insurance or income  Summary/Recommendations:  Cody Osborne is a 42 year old Caucasian male admitted with Bipolar Disorder. He will benefit from crisis stabilization, evaluation for medication, psycho-education groups for coping skills development, group therapy and case management for discharge planning.     Cody Osborne, Joesph July. 04/24/2013

## 2013-04-24 NOTE — Progress Notes (Signed)
D: Patient resting in bed with eyes closed.  Respirations even and unlabored.  Patient appears to be in no apparent distress. A: Staff to monitor Q 15 mins for safety.   R:Patient remains safe on the unit.  

## 2013-04-24 NOTE — [facility] Suicide Risk Assessment (Signed)
Suicide Risk Assessment  Admission Assessment     Nursing information obtained from:  Patient Demographic factors:  Male;Caucasian;Low socioeconomic status;Unemployed Current Mental Status:  NA Loss Factors:  Financial problems / change in socioeconomic status Historical Factors:  NA Risk Reduction Factors:  Positive social support  CLINICAL FACTORS:   Bipolar Disorder:   Mixed State Alcohol/Substance Abuse/Dependencies  COGNITIVE FEATURES THAT CONTRIBUTE TO RISK:  Closed-mindedness Polarized thinking Thought constriction (tunnel vision)    SUICIDE RISK:   Moderate:  Frequent suicidal ideation with limited intensity, and duration, some specificity in terms of plans, no associated intent, good self-control, limited dysphoria/symptomatology, some risk factors present, and identifiable protective factors, including available and accessible social support.  PLAN OF CARE: Supportive approach/coping skills/relapse prevention                              Reassess co morbidities                              Resume Effexor XR 37.5 mg with plans to increase as tolerated                                              Risperdal 0.5 gm HS with plans to increase to 1 mg  I certify that inpatient services furnished can reasonably be expected to improve the patient's condition.  Tayvia Faughnan A 04/24/2013, 4:39 PM

## 2013-04-24 NOTE — [facility] Group Notes (Signed)
[facility] LCSW Aftercare Discharge Planning Group Note   04/24/2013 12:16 PM  Participation Quality: Appropriate  Mood/Affect:  Appropriate and Depressed  Depression Rating:  6  Anxiety Rating:  4  Thoughts of Suicide:  No  Will you contract for safety?   NA  Current AVH:  No  Plan for Discharge/Comments:  Patient attending discharge planning group and actively participated in group.  CSW provided all participants with daily workbook.  He advised of being homeless and hopes to be able to get into [facility] at discharge.  He will need a referral for medication management at [facility].  [facility].  Transportation Means: Patient uses public transportation.   Supports:  Patient has limited support system.   Gabby Rackers, Joesph July

## 2013-04-24 NOTE — Progress Notes (Signed)
Adult Psychoeducational Group Note  Date:  04/24/2013 Time:  3:34 PM  Group Topic/Focus:    Participation Level:    Participation Quality:   Affect:    Cognitive:    Insight:   Engagement in Group:   Modes of Intervention:    Additional Comments:  Pt did not attend group  Shelly Bombard D 04/24/2013, 3:34 PM

## 2013-04-24 NOTE — Progress Notes (Signed)
D: Patient sitting in the dayroom eating a magazine on approach.  Patient states he is alright.  Patient states he wants to get his life together and states he wants to get his living situation together.  Patient states he would like to find a job.  Patient states he had a brother that passed away and patient states he is still dealing with that.  Patient denies SI/HI and denies AVH. A: Staff to monitor Q 15 mins for safety.  Encouragement and support offered.  Scheduled medications administered per orders. R: Patient remains safe on the unit.  Patient attended group tonight.  Patient calm, cooperative and taking administered medications.  Patient visible on the unit and interacting with peers.

## 2013-04-24 NOTE — Progress Notes (Signed)
Patient denied SI and HI.  Denied A/V hallucinations.  Denied pain.  Contracts for safety.  Patient went to dinner in dining room.  Has been cooperative and pleasant.

## 2013-04-24 NOTE — Progress Notes (Signed)
Patient ID: Cody Osborne, male   DOB: [birth year], 42 y.o.   MRN: [id number] D-Patient reports he slept with medication las night. His appetite is good and his energy level is low.  He rates his depression at 5 and his hopelessness at 7.  He denies feeling anxious or having thoughts of self harm.  A Talked with patient about his plan for recovery. R-He says he plans to say on his meds and try to avoid alcohol and drugs.  He talked about how difficult it is to be homeless and the challenges he has face being homeless.  Says that he copes with it awhile but if his bipolar disorder causes him to feel suicidal at times.  He wants to figure out his housing options while here.

## 2013-04-24 NOTE — Progress Notes (Signed)
Recreation Therapy Notes  Date: 07.15.2014 Time: 2:45pm Location: [address] Dayroom      Group Topic/Focus: Musician (AAA/T)  Session: Animal Assisted Activities (AAA)  Dog Team: [facility] & handler  Participation Level: Did not attend  [employer], LRT/CTRS  Jearl Klinefelter 04/24/2013 4:56 PM

## 2013-04-25 DIAGNOSIS — F3130 Bipolar disorder, current episode depressed, mild or moderate severity, unspecified: Secondary | ICD-10-CM

## 2013-04-25 NOTE — Progress Notes (Signed)
Adult Psychoeducational Group Note  Date:  04/25/2013 Time:  8:00PM Group Topic/Focus:  Wrap-Up Group:   The focus of this group is to help patients review their daily goal of treatment and discuss progress on daily workbooks.  Participation Level:  Did Not Attend   Additional Comments:  Pt. Didn't attend group.   [name] D 04/25/2013, 9:47 PM

## 2013-04-25 NOTE — Progress Notes (Signed)
Adult Psychoeducational Group Note  Date:  04/25/2013 Time:  1:13 AM  Group Topic/Focus:  Wrap-Up Group:   The focus of this group is to help patients review their daily goal of treatment and discuss progress on daily workbooks.  Participation Level:  Active  Participation Quality:  Appropriate and Attentive  Affect:  Flat  Cognitive:  Alert and Appropriate  Insight: Appropriate and Good  Engagement in Group:  Engaged  Modes of Intervention:  Support  Additional Comments:  Pt stated that he had met his goal of making sure that he ate well and rested yesterday so that he was able to attend groups today.   Humberto Seals Monique 04/25/2013, 1:13 AM

## 2013-04-25 NOTE — Tx Team (Signed)
Interdisciplinary Treatment Plan Update   Date Reviewed:  04/25/2013  Time Reviewed:  8:42 AM  Progress in Treatment:   Attending groups: Yes Participating in groups: Yes Taking medication as prescribed: Yes  Tolerating medication: Yes Family/Significant other contact made: Yes, contact made with mother Patient understands diagnosis: Yes  Discussing patient identified problems/goals with staff: Yes Medical problems stabilized or resolved: Yes Denies suicidal/homicidal ideation: Yes Patient has not harmed self or others: Yes  For review of initial/current patient goals, please see plan of care.  Estimated Length of Stay:  1-2 days  Reasons for Continued Hospitalization:  Anxiety Depression Medication stabilization   New Problems/Goals identified:    Discharge Plan or Barriers:   Patient is homeless.  He [facility]  Additional Comments:  N/A  Attendees:  Patient:  04/25/2013 8:42 AM   Signature:  04/25/2013 8:42 AM  Signature: 04/25/2013 8:42 AM  Signature: Harold Barban, RN 04/25/2013 8:42 AM  Signature:  Waynetta Sandy, RN 04/25/2013 8:42 AM  Signature:  04/25/2013 8:42 AM  Signature:  Juline Patch, LCSW 04/25/2013 8:42 AM  Signature:  Reyes Ivan, LCSW 04/25/2013 8:42 AM  Signature:  Sharin Grave Coordinator 04/25/2013 8:42 AM  Signature: [name], [facility] 04/25/2013 8:42 AM  Signature:    Signature:    Signature:      Scribe for Treatment Team:   Juline Patch,  04/25/2013 8:42 AM

## 2013-04-25 NOTE — [facility] Group Notes (Signed)
[facility] LCSW Group Therapy  Emotional Regulation 1:15 - 2: 30 PM        04/25/2013 2:46 PM   Type of Therapy:  Group Therapy  Participation Level:  Appropriate  Participation Quality:  Appropriate  Affect:  Appropriate  Cognitive:  Attentive Appropriate  Insight:  Engaged  Engagement in Therapy:  Engaged  Modes of Intervention:  Discussion Exploration Problem-Solving Supportive  Summary of Progress/Problems:  Group topic was emotional regulations.  Patient participated in the discussion and was able to identify frustration as an emotion he needs to regulate. He talked about how it feels to be his age and not have basic "creature comforts."    Patient was able to identify approprite coping skills and also state changes he needs to make in order for his life to improve.  Cody Osborne 04/25/2013  2:46 PM

## 2013-04-25 NOTE — Progress Notes (Signed)
D: Patient in the bed on on approach.  Patient states he has no complaints at this time.  Patient states  He has learned different coping skills since he has been here.  Patient states he is going to change the people he is around.  Patient states he wants to stay clean and sober and wants to use his resources around him.  Patient denies SI/HI and denies AVH. A: Staff to monitor Q 15 mins for safety.  Encouragement and support offered.  Scheduled medications administered per orders. R: Patient remains safe on the unit.  Patient did not attend group tonight.  Patient pleasant, cooperative and taking administered medications.  Patient visible on the unit and interacting with peers.

## 2013-04-25 NOTE — Progress Notes (Signed)
Recreation Therapy Notes  Date: 07.16.2014 Time: 3:00pm Location: [address] Dayroom      Group Topic/Focus: Leisure Education  Participation Level: Active  Participation Quality: Appropriate  Affect: Euthymic  Cognitive: Appropriate   Additional Comments: Activity: Leisure Time Clock ; Explanation: Patients were asked to identify how much time during each day is used for Work, Leisure, Self -care, and Miscellaneous time.   Patient actively participated in activity. Patient shared that he wishes to change the way his time is used. Patient shared that he wishes to add work to his time clock. Patient shared that he does take time out of each day to go for a walk or go fishing. Patient shared that having down time in each day is a way of relieving stress.   Towards the end of group session patient asked why some people experience mental illness and some do not. LRT gave basic explanation and encouraged patient to speak with LCSW and physician to get more clarification on this issue. Patient additionally asked what coping skills were and how they help you. LRT explained what coping skills are and why it is important to have healthy coping skills.   Marykay Lex Jailynne Opperman, LRT/CTRS  Maryalyce Sanjuan L 04/25/2013 4:09 PM

## 2013-04-25 NOTE — Progress Notes (Signed)
D: Patient pleasant and cooperative with staff and peers. Patient's affect/mood is appropriate to circumstance. He reported on the self inventory sheet that he slept well, appetite is good, energy level is normal and ability to pay attention is improving. Patient rated depression "4" and feelings of hopelessness "3". He's interacting with peers in the milieu and attending groups.  A: Support and encouragement provided to patient. Maintain Q15 minute checks for safety.  R: Patient receptive. Denies SI/HI/AVH. Patient remains safe.

## 2013-04-25 NOTE — [facility] Group Notes (Signed)
[facility] LCSW Aftercare Discharge Planning Group Note   04/25/2013 11:31 AM  Participation Quality: Appropriate  Mood/Affect:  Appropriate and Depressed  Depression Rating:  6  Anxiety Rating:  4  Thoughts of Suicide:  No  Will you contract for safety?   NA  Current AVH:  No  Plan for Discharge/Comments:  Patient attending discharge planning group and actively participated in group.  CSW provided all participants with daily workbook.  He reports being a little better today.  He was informed Clinical research associate spoke with mother and she asked that he call her. Patient to follow up with [facility] for medication management.  He was informed his first visit with [facility] may take a while as initial visit are on a first come first served basis.  Transportation Means: Patient uses public transportation.   Supports:  Patient has limited support system.   Tacia Hindley, Joesph July

## 2013-04-25 NOTE — Progress Notes (Signed)
Adult Psychoeducational Group Note  Date:  04/25/2013 Time:  11:00AM  Group Topic/Focus:  Crisis Planning:   The purpose of this group is to help patients create a crisis plan for use upon discharge or in the future, as needed.  Participation Level:  Active  Participation Quality:  Appropriate  Affect:  Appropriate  Cognitive:  Appropriate  Insight: Appropriate  Engagement in Group:  Engaged  Modes of Intervention:  Discussion  Additional Comments:   Patient was able to identify two of his triggers and list his support persons. He was an active participant in the morning group session.   Zacarias Pontes R 04/25/2013, 3:57 PM

## 2013-04-25 NOTE — Progress Notes (Signed)
[facility] MD Progress Note  04/25/2013 3:21 PM Cody Osborne  MRN:  [id number] Subjective: The patient is observed interacting with peers and attending the scheduled groups. He states "I think I'm doing much better is that's possible after just being started on medicine. My mood is getting better. I still have some suicidal thoughts sometimes." He rates his depression at three today. Patient reports that he will be going to [facility] when he is stable for discharge.  Diagnosis:   Axis I: Bipolar, Depressed Axis II: Deferred Axis III:  Past Medical History  Diagnosis Date  . Bipolar 1 disorder   . Depression    Axis IV: economic problems, housing problems, occupational problems, other psychosocial or environmental problems and problems with primary support group Axis V: 51-60 moderate symptoms  ADL's:  Intact  Sleep: Fair  Appetite:  Good  Suicidal Ideation:  Passive SI to shoot self Homicidal Ideation:  Denies AEB (as evidenced by):  Psychiatric Specialty Exam: Review of Systems  Constitutional: Negative.   HENT: Negative.   Eyes: Negative.   Respiratory: Negative.   Cardiovascular: Negative.   Genitourinary: Negative.   Musculoskeletal: Negative.   Skin: Negative.   Neurological: Negative.   Endo/Heme/Allergies: Negative.   Psychiatric/Behavioral: Positive for depression, suicidal ideas and substance abuse. Negative for hallucinations and memory loss. The patient is nervous/anxious. The patient does not have insomnia.     Blood pressure 112/77, pulse 90, temperature 97.8 F (36.6 C), temperature source Oral, resp. rate 16, height 5' 9.5" (1.765 m), weight 103.874 kg (229 lb).Body mass index is 33.34 kg/(m^2).  General Appearance: Casual and Fairly Groomed  Patent attorney::  Good  Speech:  Clear and Coherent  Volume:  Normal  Mood:  Anxious  Affect:  Full Range  Thought Process:  Goal Directed and Intact  Orientation:  Full (Time, Place, and Person)  Thought Content:   Rumination  Suicidal Thoughts:  Yes.  with intent/plan  Homicidal Thoughts:  No  Memory:  Immediate;   Good Recent;   Good Remote;   Good  Judgement:  Fair  Insight:  Fair  Psychomotor Activity:  Restlessness  Concentration:  Fair  Recall:  Good  Akathisia:  No  Handed:  Right  AIMS (if indicated):     Assets:  Communication Skills Desire for Improvement Leisure Time Physical Health Resilience  Sleep:  Number of Hours: 5   Current Medications: Current Facility-Administered Medications  Medication Dose Route Frequency Provider Last Rate Last Dose  . acetaminophen (TYLENOL) tablet 650 mg  650 mg Oral Q6H PRN [name], NP      . alum & mag hydroxide-simeth (MAALOX/MYLANTA) 200-200-20 MG/5ML suspension 30 mL  30 mL Oral Q4H PRN [name], NP      . hydrOXYzine (ATARAX/VISTARIL) tablet 50 mg  50 mg Oral QHS,MR X 1 Kerry Hough, PA-C   50 mg at 04/24/13 2111  . magnesium hydroxide (MILK OF MAGNESIA) suspension 30 mL  30 mL Oral Daily PRN [name], NP      . nicotine (NICODERM CQ - dosed in mg/24 hours) patch 14 mg  14 mg Transdermal Daily [name], MD   14 mg at 04/25/13 0616  . risperiDONE (RISPERDAL) tablet 0.5 mg  0.5 mg Oral QHS [name], NP   0.5 mg at 04/24/13 2111  . venlafaxine (EFFEXOR) tablet 37.5 mg  37.5 mg Oral QAC breakfast [name], NP   37.5 mg at 04/25/13 0865    Lab Results:  No results found for this or any previous visit (from the past 48 hour(s)).  Physical Findings: AIMS: Facial and Oral Movements Muscles of Facial Expression: None, normal Lips and Perioral Area: None, normal Jaw: None, normal Tongue: None, normal,Extremity Movements Upper (arms, wrists, hands, fingers): None, normal Lower (legs, knees, ankles, toes): None, normal, Trunk Movements Neck, shoulders, hips: None, normal, Overall Severity Severity of abnormal movements (highest score from questions above): None, normal Patient's awareness of abnormal  movements (rate only patient's report): No Awareness, Dental Status Current problems with teeth and/or dentures?: No Does patient usually wear dentures?: No  CIWA:  CIWA-Ar Total: 1 COWS:  COWS Total Score: 1  Treatment Plan Summary: Daily contact with patient to assess and evaluate symptoms and progress in treatment Medication management  Plan: Continue crisis management and stabilization.  Medication management: Reviewed with patient who stated no untoward effects. Tolerating Effexor 75 mg and Risperdal 0.5 mg at hs that was initiated yesterday. Encouraged patient to attend groups and participate in group counseling sessions and activities.  Discharge plan in progress.  Address health issues: Vitals reviewed and stable.  Continue current treatment plan.   Medical Decision Making Problem Points:  Established problem, stable/improving (1) and Review of psycho-social stressors (1) Data Points:  Review of medication regiment & side effects (2)  I certify that inpatient services furnished can reasonably be expected to improve the patient's condition.   Jenilee Franey NP-C 04/25/2013, 3:21 PM

## 2013-04-26 DIAGNOSIS — F319 Bipolar disorder, unspecified: Secondary | ICD-10-CM

## 2013-04-26 DIAGNOSIS — F1910 Other psychoactive substance abuse, uncomplicated: Secondary | ICD-10-CM

## 2013-04-26 MED ORDER — RISPERIDONE 1 MG PO TABS: 1.0000 mg | ORAL_TABLET | Freq: Every day | ORAL | Status: DC

## 2013-04-26 MED ORDER — RISPERIDONE 1 MG PO TABS
1.0000 mg | ORAL_TABLET | Freq: Every day | ORAL | Status: DC
  Filled 2013-04-26: qty 1
  Filled 2013-04-26: qty 14

## 2013-04-26 MED ORDER — VENLAFAXINE HCL 75 MG PO TABS: 75.0000 mg | ORAL_TABLET | Freq: Every day | ORAL | Status: DC

## 2013-04-26 MED ORDER — VENLAFAXINE HCL 75 MG PO TABS
75.0000 mg | ORAL_TABLET | Freq: Every day | ORAL | Status: DC
  Filled 2013-04-26: qty 14
  Filled 2013-04-26: qty 1

## 2013-04-26 MED ORDER — HYDROXYZINE HCL 50 MG PO TABS: 50.0000 mg | ORAL_TABLET | Freq: Every evening | ORAL | Status: DC | PRN

## 2013-04-26 NOTE — Discharge Summary (Signed)
Physician Discharge Summary Note  Patient:  Cody Osborne is an 41 y.o., male MRN:  [id number] DOB:  18-Jan-1971 Patient phone:  [phone] (home)  Patient address:   [address] [place] [address]   Date of Admission:  04/23/2013 Date of Discharge:  04/26/13  Discharge Diagnoses: Active Problems:   * No active hospital problems. *  Axis Diagnosis:  AXIS I: Bipolar Disorder, Polysubstance Abuse  AXIS II: Deferred  AXIS III:  Past Medical History   Diagnosis  Date   .  Bipolar 1 disorder    .  Depression     AXIS IV: housing problems, occupational problems and other psychosocial or environmental problems  AXIS V: 61-70 mild symptoms  Level of Care:  OP  Hospital Course:   Cody Osborne is an 42 y.o. male. Pt presents voluntarily to [facility] with SI and having been off his psych meds since his arrival from [facility] to [place] one week ago. Pt has been inpatient previously: [facility], [facility], [address], in [place], [facility] - for bipolar disorder and crack cocaine abuse. Pt states he hasn't used crack since Feb 2014. Today patient states "I've been abusing substances for so long it's been hard for me to get an accurate mental health diagnosis." Patient talks about being homeless since July 2012 because "When I drink and use drugs I call out from work and stuff so I end up losing jobs. I've been all around at shelters, staying with friends, hanging with people who stay in tents. My family is old and don't care about me. I might as well put a bullet in my brain or drown myself like my grandmother did." He reports not having taken Effexor since 2000. The patient talks about wanting to get a vehicle and a stable living situation stating "I have filled out the paperwork for [facility]. I'm 42 years old and I need to get it together." He denies any legal charges.  While a patient in this hospital, Cody Osborne was enrolled in group counseling and activities as well as  received the following medication Current facility-administered medications:acetaminophen (TYLENOL) tablet 650 mg, 650 mg, Oral, Q6H PRN, Cody Navy, NP;  alum & mag hydroxide-simeth (MAALOX/MYLANTA) 200-200-20 MG/5ML suspension 30 mL, 30 mL, Oral, Q4H PRN, Cody Navy, NP;  hydrOXYzine (ATARAX/VISTARIL) tablet 50 mg, 50 mg, Oral, QHS,MR X 1, Cody E Simon, PA-C, 50 mg at 04/25/13 2151 magnesium hydroxide (MILK OF MAGNESIA) suspension 30 mL, 30 mL, Oral, Daily PRN, Cody Navy, NP;  nicotine (NICODERM CQ - dosed in mg/24 hours) patch 14 mg, 14 mg, Transdermal, Daily, Cody Nipper, MD, 14 mg at 04/26/13 0644;  risperiDONE (RISPERDAL) tablet 1 mg, 1 mg, Oral, QHS, Cody Kaufmann, NP;  Melene Muller ON 04/27/2013] venlafaxine [facility]) tablet 75 mg, 75 mg, Oral, QAC breakfast, Cody Kaufmann, NP The patient had not been taking any prescribed medications for some time due to moving around frequently. He reported that Effexor had worked well for him years ago in 2000. The patient was started on Effexor 37.5 mg for depression and Risperdal 0.5 mg at bedtime to improve mood stability. Cody Osborne in his mood and appeared less anxious during interactions. Prior to his discharge his medications were increased by the MD to Effexor 75 mg daily and Risperdal 1 mg at bedtime. The patient reported that he would be staying at [facility] for a time until he could secure a job. His goal was to obtain  a stable living situation and reconnect with his family. The patient was able to express optimism about his future.  Patient attended treatment team meeting this am and met with treatment team members. Pt symptoms, treatment plan and response to treatment discussed. [name] endorsed that their symptoms have improved. Pt also stated that they are stable for discharge.  In other to control Active Problems:   * No active hospital problems. * , they will continue psychiatric care on  outpatient basis. They will follow-up at  Follow-up Information   Follow up with [facility] On 04/27/2013. (Please go to [facility]'s walk in clinic on Friday, April 27, 2013 or any weekday between 8AM-3PM for medication management.)    Contact information:   [address] [place], [place]   [address] [phone]    .  In addition they were instructed to take all your medications as prescribed by your mental healthcare provider, to report any adverse effects and or reactions from your medicines to your outpatient provider promptly, patient is instructed and cautioned to not engage in alcohol and or illegal drug use while on prescription medicines, in the event of worsening symptoms, patient is instructed to call the crisis hotline, 911 and or go to the nearest ED for appropriate evaluation and treatment of symptoms.   Upon discharge, patient adamantly denies suicidal, homicidal ideations, auditory, visual hallucinations and or delusional thinking. They left [facility] with all personal belongings in no apparent distress.  Consults:  See electronic record for details  Significant Diagnostic Studies:  See electronic record for details  Discharge Vitals:   Blood pressure 136/81, pulse 78, temperature 96.9 F (36.1 C), temperature source Oral, resp. rate 18, height 5' 9.5" (1.765 m), weight 103.874 kg (229 lb)..  Mental Status Exam: See Mental Status Examination and Suicide Risk Assessment completed by Attending Physician prior to discharge.  Discharge destination:  Home  Is patient on multiple antipsychotic therapies at discharge:  No  Has Patient had three or more failed trials of antipsychotic monotherapy by history: N/A Recommended Plan for Multiple Antipsychotic Therapies: N/A    Medication List    STOP taking these medications       venlafaxine XR 150 MG 24 hr capsule  Commonly known as:  EFFEXOR-XR  Replaced by:  venlafaxine 75 MG tablet      TAKE these medications     Indication    hydrOXYzine 50 MG tablet  Commonly known as:  ATARAX/VISTARIL  Take 1 tablet (50 mg total) by mouth at bedtime and may repeat dose one time if needed. For sleep   Indication:  Sedation     risperiDONE 1 MG tablet  Commonly known as:  RISPERDAL  Take 1 tablet (1 mg total) by mouth at bedtime. For mood control.   Indication:  Manic-Depression     venlafaxine 75 MG tablet  Commonly known as:  EFFEXOR  Take 1 tablet (75 mg total) by mouth daily before breakfast. For depression.  Start taking on:  04/27/2013   Indication:  Major Depressive Disorder           Follow-up Information   Follow up with [facility] On 04/27/2013. (Please go to [facility] walk in clinic on Friday, April 27, 2013 or any weekday between 8AM-3PM for medication management.)    Contact information:   [address] [place], [place]   [address] [phone]     Follow-up recommendations:   Activities: Resume typical activities Diet: Resume typical diet Tests: none Other: Follow up with  outpatient provider and report any side effects to out patient prescriber. Continue to work on life style changes that could help better manage your mood/continue t work your relapse prevention plan Comments:  Take all your medications as prescribed by your mental healthcare provider. Report any adverse effects and or reactions from your medicines to your outpatient provider promptly. Patient is instructed and cautioned to not engage in alcohol and or illegal drug use while on prescription medicines. In the event of worsening symptoms, patient is instructed to call the crisis hotline, 911 and or go to the nearest ED for appropriate evaluation and treatment of symptoms. Follow-up with your primary care provider for your other medical issues, concerns and or health care needs.  SignedFransisca Kaufmann NP-C 04/26/2013 11:54 AM Agree with assessment and plan Reymundo Poll. Dub Mikes, M.D.

## 2013-04-26 NOTE — [facility] Group Notes (Signed)
[facility] LCSW Aftercare Discharge Planning Group Note   04/26/2013 9:41 AM   Participation Quality:  Appropriate   Mood/Affect:  Appropriate  Depression Rating:    Anxiety Rating:    Thoughts of Suicide:  No  Will you contract for safety?   NA  Current AVH:  No  Plan for Discharge/Comments:  Patient attending discharge planning group and actively participated in group.  CSW provided all participants with daily workbook.  Patient reports being better and ready to discharge today.  He requested assistance with medications.  Patient informed we will provide a two week supply of medications but he will need to follow up with [facility], preferably tomorrow,in order that he does not run out of meds.   Transportation Means: Patient uses public transportation.   Supports:  Patient has limited support system.    Ariyel Jeangilles, Joesph July

## 2013-04-26 NOTE — [facility] Suicide Risk Assessment (Signed)
Suicide Risk Assessment  Discharge Assessment     Demographic Factors:  Male, Caucasian and Unemployed  Mental Status Per Nursing Assessment::   On Admission:  NA  Current Mental Status by Physician: In full contact with reality. There are no suicidal ideas, plans or intent. His mood is euthymic, his affect is appropriate. He is willing and motivate to pursue further outpatient treatment   Loss Factors: NA  Historical Factors: NA  Risk Reduction Factors:   Positive social support  Continued Clinical Symptoms:  Bipolar Disorder:   Mixed State Alcohol/Substance Abuse/Dependencies  Cognitive Features That Contribute To Risk:  Polarized thinking Thought constriction (tunnel vision)    Suicide Risk:  Minimal: No identifiable suicidal ideation.  Patients presenting with no risk factors but with morbid ruminations; may be classified as minimal risk based on the severity of the depressive symptoms  Discharge Diagnoses:   AXIS I:  Bipolar Disorder, Polysubstance Abuse AXIS II:  Deferred AXIS III:   Past Medical History  Diagnosis Date  . Bipolar 1 disorder   . Depression    AXIS IV:  housing problems, occupational problems and other psychosocial or environmental problems AXIS V:  61-70 mild symptoms  Plan Of Care/Follow-up recommendations:  Activity:  as tolerated Diet:  regular Follow up [facility] Is patient on multiple antipsychotic therapies at discharge:  No   Has Patient had three or more failed trials of antipsychotic monotherapy by history:  No  Recommended Plan for Multiple Antipsychotic Therapies: N/A   Ezra Marquess A 04/26/2013, 12:17 PM

## 2013-04-26 NOTE — Progress Notes (Signed)
[facility] Adult Case Management Discharge Plan :  Will you be returning to the same living situation after discharge: No.  Patient to explore options. At discharge, do you have transportation home?:Yes,  Patient assisted with bus pass. Do you have the ability to pay for your medications:No.  Patient assisted with indigent medications.  Release of information consent forms completed and in the chart;  Patient's signature needed at discharge.  Patient to Follow up at: Follow-up Information   Follow up with [facility] On 04/27/2013. (Please go to [facility]'s walk in clinic on Friday, April 27, 2013 or any weekday between 8AM-3PM for medication management.)    Contact information:   [address] [place], [place]   [address] [phone]      Patient denies SI/HI:   Patient no longer endorsing SI/HI or other thoughts of self harm.     Safety Planning and Suicide Prevention discussed: .Reviewed with all patients during discharge planning group  Jess Toney, Joesph July 04/26/2013, 9:43 AM

## 2013-04-26 NOTE — Progress Notes (Signed)
[facility] Group Notes:  (Nursing/MHT/Case Management/Adjunct)  Date:  04/26/2013  Time:  12:07 PM  Type of Therapy:  Therapeutic Activity  Participation Level:  Active  Participation Quality:  Appropriate and Attentive  Affect:  Appropriate  Cognitive:  Alert, Appropriate and Oriented  Insight:  Appropriate, Good and Improving  Engagement in Group:  Engaged, Improving and Supportive  Modes of Intervention:  Discussion, Education and Support  Summary of Progress/Problems: Pt talked freely about his memories as a child and the activities he enjoyed: fishing with his father, playing with his dogs, biking, and playing army with his friends. His affect was bright when he reminisced.  Reynolds Bowl 04/26/2013, 12:07 PM

## 2013-04-26 NOTE — Progress Notes (Signed)
Discharge Note: Discharge instructions/prescriptions/medication samples and bus pass given to patient. Patient verbalized understanding of discharge instructions and prescriptions. Writer informed the patient of the location of the bus stop. Returned belongings to patient. Denies SI/HI/AVH. Patient d/c without incident to the front lobby.

## 2013-05-01 NOTE — Progress Notes (Signed)
Patient Discharge Instructions:  After Visit Summary (AVS):   Faxed to:  05/01/13 Discharge Summary Note:   Faxed to:  05/01/13 Psychiatric Admission Assessment Note:   Faxed to:  05/01/13 Suicide Risk Assessment - Discharge Assessment:   Faxed to:  05/01/13 Faxed/Sent to the Next Level Care provider:  05/01/13 Faxed to [facility] @ [phone]  Jerelene Redden, 05/01/2013, 4:21 PM

## 2013-05-20 ENCOUNTER — Encounter (HOSPITAL_COMMUNITY): Payer: Self-pay | Admitting: *Deleted

## 2013-05-20 ENCOUNTER — Emergency Department (HOSPITAL_COMMUNITY): Payer: Self-pay

## 2013-05-20 ENCOUNTER — Emergency Department (HOSPITAL_COMMUNITY)
Admission: EM | Admit: 2013-05-20 | Discharge: 2013-05-21 | Disposition: A | Discharge status: Home / Self Care | Payer: Self-pay | Attending: Emergency Medicine | Admitting: Emergency Medicine

## 2013-05-20 DIAGNOSIS — J40 Bronchitis, not specified as acute or chronic: Secondary | ICD-10-CM | POA: Insufficient documentation

## 2013-05-20 DIAGNOSIS — Z8659 Personal history of other mental and behavioral disorders: Secondary | ICD-10-CM | POA: Insufficient documentation

## 2013-05-20 DIAGNOSIS — F17200 Nicotine dependence, unspecified, uncomplicated: Secondary | ICD-10-CM | POA: Insufficient documentation

## 2013-05-20 DIAGNOSIS — J029 Acute pharyngitis, unspecified: Secondary | ICD-10-CM | POA: Insufficient documentation

## 2013-05-20 DIAGNOSIS — R509 Fever, unspecified: Secondary | ICD-10-CM | POA: Insufficient documentation

## 2013-05-20 NOTE — ED Notes (Signed)
Pt states URI for the past few days pt states cough and sore throat after he coughs. Pt states fever with chills. Pt states slight productive cough.

## 2013-05-21 MED ORDER — ALBUTEROL SULFATE HFA 108 (90 BASE) MCG/ACT IN AERS
2.0000 | INHALATION_SPRAY | RESPIRATORY_TRACT | Status: DC | PRN
  Administered 2013-05-21: 2 via RESPIRATORY_TRACT
  Filled 2013-05-21: qty 6.7

## 2013-05-21 NOTE — ED Provider Notes (Signed)
  CSN: [id number]     Arrival date & time 05/20/13  2259 History     First MD Initiated Contact with Patient 05/21/13 0143     Chief Complaint  Patient presents with  . URI   (Consider location/radiation/quality/duration/timing/severity/associated sxs/prior Treatment) HPI This is a 42 year old homeless man with a three-day history of fever, scratchy throat, cough and chest soreness. He is also had chills. The cough is exacerbated by taking deep breaths. The cough has been slightly productive. He has not been able to afford any over-the-counter cough or cold medications. He denies nasal congestion or rhinorrhea.   Past Medical History  Diagnosis Date  . Bipolar 1 disorder   . Depression    History reviewed. No pertinent past surgical history. History reviewed. No pertinent family history. History  Substance Use Topics  . Smoking status: Current Every Day Smoker -- 0.50 packs/day for 8 years    Types: Cigarettes  . Smokeless tobacco: Not on file  . Alcohol Use: Yes     Comment: occ    Review of Systems  All other systems reviewed and are negative.    Allergies  Review of patient's allergies indicates no known allergies.  Home Medications  No current outpatient prescriptions on file. BP 136/82  Pulse 113  Temp(Src) 98.4 F (36.9 C) (Oral)  Resp 20  SpO2 100%  Physical Exam General: Well-developed, well-nourished male in no acute distress; appearance consistent with age of record HENT: normocephalic, atraumatic; no nasal congestion; no pharyngeal erythema or exudate Eyes: pupils equal round and reactive to light; extraocular muscles intact Neck: supple Heart: regular rate and rhythm Lungs: clear to auscultation bilaterally; coughing on attempted deep breathing Abdomen: soft; nondistended; nontender; bowel sounds present Extremities: No deformity; full range of motion; pulses normal Neurologic: Awake, alert and oriented; motor function intact in all extremities and  symmetric; no facial droop Skin: Warm and dry Psychiatric: Circumstantial    ED Course   Procedures (including critical care time)    MDM  Nursing notes and vitals signs, including pulse oximetry, reviewed.  Summary of this visit's results, reviewed by myself:  Labs:  No results found for this or any previous visit (from the past 24 hour(s)).  Imaging Studies: Dg Chest 2 View  05/20/2013   *RADIOLOGY REPORT*  Clinical Data: Cough.  CHEST - 2 VIEW  Comparison: No priors.  Findings: Lung volumes are normal.  No consolidative airspace disease.  No pleural effusions.  No pneumothorax.  No pulmonary nodule or mass noted.  Pulmonary vasculature and the cardiomediastinal silhouette are within normal limits.  IMPRESSION: 1. No radiographic evidence of acute cardiopulmonary disease.   Original Report Authenticated By: Trudie Reed, M.D.      Hanley Seamen, MD 05/21/13 [phone]

## 2013-05-30 ENCOUNTER — Emergency Department (HOSPITAL_COMMUNITY)
Admission: EM | Admit: 2013-05-30 | Discharge: 2013-05-30 | Disposition: A | Discharge status: Home / Self Care | Payer: No Typology Code available for payment source | Attending: Emergency Medicine | Admitting: Emergency Medicine

## 2013-05-30 ENCOUNTER — Encounter (HOSPITAL_COMMUNITY): Payer: Self-pay | Admitting: Emergency Medicine

## 2013-05-30 DIAGNOSIS — R45850 Homicidal ideations: Secondary | ICD-10-CM | POA: Insufficient documentation

## 2013-05-30 DIAGNOSIS — F329 Major depressive disorder, single episode, unspecified: Secondary | ICD-10-CM | POA: Insufficient documentation

## 2013-05-30 DIAGNOSIS — F1021 Alcohol dependence, in remission: Secondary | ICD-10-CM | POA: Insufficient documentation

## 2013-05-30 DIAGNOSIS — R45851 Suicidal ideations: Secondary | ICD-10-CM | POA: Insufficient documentation

## 2013-05-30 DIAGNOSIS — F3289 Other specified depressive episodes: Secondary | ICD-10-CM | POA: Insufficient documentation

## 2013-05-30 DIAGNOSIS — Z8659 Personal history of other mental and behavioral disorders: Secondary | ICD-10-CM | POA: Insufficient documentation

## 2013-05-30 DIAGNOSIS — F17200 Nicotine dependence, unspecified, uncomplicated: Secondary | ICD-10-CM | POA: Insufficient documentation

## 2013-05-30 HISTORY — DX: Other psychoactive substance abuse, uncomplicated: F19.10

## 2013-05-30 HISTORY — DX: Alcohol abuse, uncomplicated: F10.10

## 2013-05-30 LAB — CBC WITH DIFFERENTIAL/PLATELET
HCT: 45.5 % (ref 39.0–52.0)
Hemoglobin: 15.8 g/dL (ref 13.0–17.0)
Lymphocytes Relative: 19 % (ref 12–46)
Lymphs Abs: 2.5 10*3/uL (ref 0.7–4.0)
MCV: 88.2 fL (ref 78.0–100.0)
Monocytes Absolute: 0.8 10*3/uL (ref 0.1–1.0)
Monocytes Relative: 6 % (ref 3–12)
Neutro Abs: 9.5 10*3/uL — ABNORMAL HIGH (ref 1.7–7.7)
WBC: 13 10*3/uL — ABNORMAL HIGH (ref 4.0–10.5)

## 2013-05-30 LAB — POCT I-STAT, CHEM 8
BUN: 7 mg/dL (ref 6–23)
Calcium, Ion: 1.15 mmol/L (ref 1.12–1.23)
HCT: 49 % (ref 39.0–52.0)
Hemoglobin: 16.7 g/dL (ref 13.0–17.0)
TCO2: 24 mmol/L (ref 0–100)

## 2013-05-30 LAB — RAPID URINE DRUG SCREEN, HOSP PERFORMED
Amphetamines: NOT DETECTED
Barbiturates: NOT DETECTED
Benzodiazepines: NOT DETECTED
Tetrahydrocannabinol: POSITIVE — AB

## 2013-05-30 LAB — ETHANOL: Alcohol, Ethyl (B): <11 mg/dL (ref 0–11)

## 2013-05-30 NOTE — Progress Notes (Signed)
Per discussion with psychiatrist pt psychiatrically stable for discharge. Pt interested in shelter. Pt plans to go to [facility] by 2pm to complete intake with carl. Pt provided with bus pass and fare for part pass.   Catha Gosselin, [place] [phone]  ED CSW 05/30/2013 1044am

## 2013-05-30 NOTE — ED Provider Notes (Signed)
10:45 AM Pt was seen and evaluated by the psychiatry team. Deemed to be stable from psychiatric standpoint. Discharge to homeless shelter. SW involved. Please see consultation notes  Lyanne Co, MD 05/30/13 1045

## 2013-05-30 NOTE — ED Notes (Signed)
Pt states he is feeling suicidal and homicidal   Pt states he was seen here a month ago or so and was sent [facility]  Pt states he has been drinking and smoking marijuana  Pt states he is homeless, his brother died 2 years ago from a heart attack, his mother will not let him come live with her, he has never been married or had children   Pt states so it doesn't matter if he lives or not really  Pt states then he starts thinking about the things in this world that upset him and he thinks of hurting others  Pt expressing hopelessness and got teary a couple of times in conversation

## 2013-05-30 NOTE — Consult Note (Signed)
[facility] Psychiatry Consult   Reason for Consult: Evaluation for inpatient treatment Referring Physician:  EDP  Cody Osborne is an 42 y.o. male.  Assessment: AXIS I:  Depressive Disorder NOS AXIS II:  Deferred AXIS III:   Past Medical History  Diagnosis Date  . Bipolar 1 disorder   . Depression   . Alcohol abuse   . Substance abuse    AXIS IV:  housing problems, other psychosocial or environmental problems, problems related to social environment and problems with primary support group AXIS V:  61-70 mild symptoms  Plan:  No evidence of imminent risk to self or others at present.   Patient does not meet criteria for psychiatric inpatient admission. Supportive therapy provided about ongoing stressors. Discussed crisis plan, support from social network, calling 911, coming to the Emergency Department, and calling Suicide Hotline.  Subjective:   Cody Osborne is a 42 y.o. male.  HPI:  Patient states that he presented to [facility] because he was tired of living in a tent and need somewhere to live.  Patient states that he was discharged from [facility] last month and was prescribed Effexor and Risperdal which he is not taking now.  Patient also states that he did now follow up with his outpatient referral.  Patient states that he was getting money from his mother and living in the tent was fine but the bug bites, staying up late at night, the drinking has to stop.  Patient states that he is ready not to stay in a structured environment and follow the rules.  "I need to stay where I can get away from the alcohol, I'm ready to follow the rules so that I can get my life together." Patient denies suicidal ideation, homicidal ideation, psychosis, and paranoia.    Past Psychiatric History: Past Medical History  Diagnosis Date  . Bipolar 1 disorder   . Depression   . Alcohol abuse   . Substance abuse     reports that he has been smoking Cigarettes.  He has a 4 pack-year smoking history. He does not have  any smokeless tobacco history on file. He reports that  drinks alcohol. He reports that he uses illicit drugs (Marijuana and Methamphetamines). Family History  Problem Relation Age of Onset  . Heart attack Father   . Heart attack Brother            Allergies:  No Known Allergies  Past Psychiatric History: Diagnosis:  Depression  Hospitalizations:  D/C [facility] 7/14  Outpatient Care:  [facility]  Substance Abuse Care:    Self-Mutilation:    Suicidal Attempts:    Violent Behaviors:     Objective: Blood pressure 122/74, pulse 73, temperature 98 F (36.7 C), temperature source Oral, resp. rate 20, height 5\' 9"  (1.753 m), weight 103.08 kg (227 lb 4 oz), SpO2 98.00%.Body mass index is 33.54 kg/(m^2). Results for orders placed during the hospital encounter of 05/30/13 (from the past 72 hour(s))  CBC WITH DIFFERENTIAL     Status: Abnormal   Collection Time    05/30/13  1:35 AM      Result Value Range   WBC 13.0 (*) 4.0 - 10.5 K/uL   RBC 5.16  4.22 - 5.81 MIL/uL   Hemoglobin 15.8  13.0 - 17.0 g/dL   HCT 40.9  81.1 - 91.4 %   MCV 88.2  78.0 - 100.0 fL   MCH 30.6  26.0 - 34.0 pg   MCHC 34.7  30.0 - 36.0 g/dL  RDW 13.7  11.5 - 15.5 %   Platelets 362  150 - 400 K/uL   Neutrophils Relative % 74  43 - 77 %   Neutro Abs 9.5 (*) 1.7 - 7.7 K/uL   Lymphocytes Relative 19  12 - 46 %   Lymphs Abs 2.5  0.7 - 4.0 K/uL   Monocytes Relative 6  3 - 12 %   Monocytes Absolute 0.8  0.1 - 1.0 K/uL   Eosinophils Relative 1  0 - 5 %   Eosinophils Absolute 0.1  0.0 - 0.7 K/uL   Basophils Relative 0  0 - 1 %   Basophils Absolute 0.0  0.0 - 0.1 K/uL  ETHANOL     Status: None   Collection Time    05/30/13  1:35 AM      Result Value Range   Alcohol, Ethyl (B) <11  0 - 11 mg/dL   Comment:            LOWEST DETECTABLE LIMIT FOR     SERUM ALCOHOL IS 11 mg/dL     FOR MEDICAL PURPOSES ONLY  URINE RAPID DRUG SCREEN (HOSP PERFORMED)     Status: Abnormal   Collection Time    05/30/13  1:36 AM       Result Value Range   Opiates NONE DETECTED  NONE DETECTED   Cocaine NONE DETECTED  NONE DETECTED   Benzodiazepines NONE DETECTED  NONE DETECTED   Amphetamines NONE DETECTED  NONE DETECTED   Tetrahydrocannabinol POSITIVE (*) NONE DETECTED   Barbiturates NONE DETECTED  NONE DETECTED   Comment:            DRUG SCREEN FOR MEDICAL PURPOSES     ONLY.  IF CONFIRMATION IS NEEDED     FOR ANY PURPOSE, NOTIFY LAB     WITHIN 5 DAYS.                LOWEST DETECTABLE LIMITS     FOR URINE DRUG SCREEN     Drug Class       Cutoff (ng/mL)     Amphetamine      1000     Barbiturate      200     Benzodiazepine   200     Tricyclics       300     Opiates          300     Cocaine          300     THC              50  POCT I-STAT, CHEM 8     Status: None   Collection Time    05/30/13  2:00 AM      Result Value Range   Sodium 142  135 - 145 mEq/L   Potassium 3.9  3.5 - 5.1 mEq/L   Chloride 106  96 - 112 mEq/L   BUN 7  6 - 23 mg/dL   Creatinine, Ser 9.52  0.50 - 1.35 mg/dL   Glucose, Bld 99  70 - 99 mg/dL   Calcium, Ion 8.41  3.24 - 1.23 mmol/L   TCO2 24  0 - 100 mmol/L   Hemoglobin 16.7  13.0 - 17.0 g/dL   HCT 40.1  02.7 - 25.3 %     No current facility-administered medications for this encounter.   No current outpatient prescriptions on file.    Psychiatric Specialty Exam:     Blood pressure  122/74, pulse 73, temperature 98 F (36.7 C), temperature source Oral, resp. rate 20, height 5\' 9"  (1.753 m), weight 103.08 kg (227 lb 4 oz), SpO2 98.00%.Body mass index is 33.54 kg/(m^2).  General Appearance: Disheveled  Eye Contact::  Good  Speech:  Clear and Coherent and Normal Rate  Volume:  Normal  Mood:  Anxious  Affect:  Appropriate  Thought Process:  Circumstantial and Goal Directed  Orientation:  Full (Time, Place, and Person)  Thought Content:  WDL  Suicidal Thoughts:  No  Homicidal Thoughts:  No  Memory:  Immediate;   Good Recent;   Good Remote;   Good  Judgement:  Fair   Insight:  Fair and Present  Psychomotor Activity:  Normal  Concentration:  Good  Recall:  Good  Akathisia:  No  Handed:  Right  AIMS (if indicated):     Assets:  Communication Skills  Sleep:      Treatment Plan Summary: Discharge to Shelter and outpatient resources  Recommendation/Disposition:  Arrange for bed at shelter.  Discharge; patient to follow up with [facility] outpatient services for medication management and therapy.   Assunta Found, FNP-BC 05/30/2013 10:57 AM  I have personally seen the patient and agreed with the findings and involved in the treatment plan. Kathryne Sharper, MD

## 2013-05-30 NOTE — ED Provider Notes (Signed)
  CSN: [id number]     Arrival date & time 05/30/13  0007 History     First MD Initiated Contact with Patient 05/30/13 0015     Chief Complaint  Patient presents with  . Medical Clearance   (Consider location/radiation/quality/duration/timing/severity/associated sxs/prior Treatment) HPI Comments: Patient states, that he was doing methamphetamines of the week to of his qualify" felt like he wants to hurt people or himself.  He lacks direction in his life, unable to hold a job for several years.  His 42 year old mother has runoff is a, and he, feels depressed, and the anniversary of his brother's death 2 years ago.  He is reliving the event, and feel sad  The history is provided by the patient.    Past Medical History  Diagnosis Date  . Bipolar 1 disorder   . Depression   . Alcohol abuse   . Substance abuse    History reviewed. No pertinent past surgical history. Family History  Problem Relation Age of Onset  . Heart attack Father   . Heart attack Brother    History  Substance Use Topics  . Smoking status: Current Every Day Smoker -- 0.50 packs/day for 8 years    Types: Cigarettes  . Smokeless tobacco: Not on file  . Alcohol Use: Yes     Comment: occ    Review of Systems  Constitutional: Negative for fever.  Cardiovascular: Negative for chest pain.  Psychiatric/Behavioral: Positive for suicidal ideas.    Allergies  Review of patient's allergies indicates no known allergies.  Home Medications  No current outpatient prescriptions on file. BP 119/73  Pulse 91  Temp(Src) 98.4 F (36.9 C) (Oral)  Resp 20  Ht 5\' 9"  (1.753 m)  Wt 227 lb 4 oz (103.08 kg)  BMI 33.54 kg/m2  SpO2 98% Physical Exam  Nursing note and vitals reviewed. Constitutional: He is oriented to person, place, and time. He appears well-developed and well-nourished.  Eyes: Pupils are equal, round, and reactive to light.  Neck: Normal range of motion.  Cardiovascular: Normal rate and regular rhythm.    Pulmonary/Chest: Effort normal.  Musculoskeletal: Normal range of motion.  Neurological: He is alert and oriented to person, place, and time.  Skin: Skin is warm. No rash noted.  Psychiatric: His behavior is normal. Cognition and memory are normal. He expresses inappropriate judgment. He exhibits a depressed mood. He expresses homicidal and suicidal ideation. He expresses no suicidal plans.    ED Course   Procedures (including critical care time)  Labs Reviewed  CBC WITH DIFFERENTIAL - Abnormal; Notable for the following:    WBC 13.0 (*)    Neutro Abs 9.5 (*)    All other components within normal limits  URINE RAPID DRUG SCREEN (HOSP PERFORMED) - Abnormal; Notable for the following:    Tetrahydrocannabinol POSITIVE (*)    All other components within normal limits  ETHANOL  POCT I-STAT, CHEM 8   No results found. 1. Depression     MDM     Arman Filter, NP 06/06/13 2050

## 2013-05-30 NOTE — ED Notes (Addendum)
Pt has been changed into blue scrubs.  Pt has shirt, shorts, hat, shoes,underwear, socks, duffle bag, cellphone( samsung), and blanket.  Checked by [employer].  Wanded and seen by security.  Belongings placed in locker 30  TCU.

## 2013-06-06 NOTE — ED Provider Notes (Signed)
Medical screening examination/treatment/procedure(s) were performed by non-physician practitioner and as supervising physician I was immediately available for consultation/collaboration.    Gilda Crease, MD 06/06/13 [phone]

## 2014-04-04 IMAGING — CR DG CHEST 2V
2 series · 2 of 2 positions shown · non-contrast
Comparison: No priors.

CLINICAL DATA: Cough.

CHEST - 2 VIEW

[w chest pa]
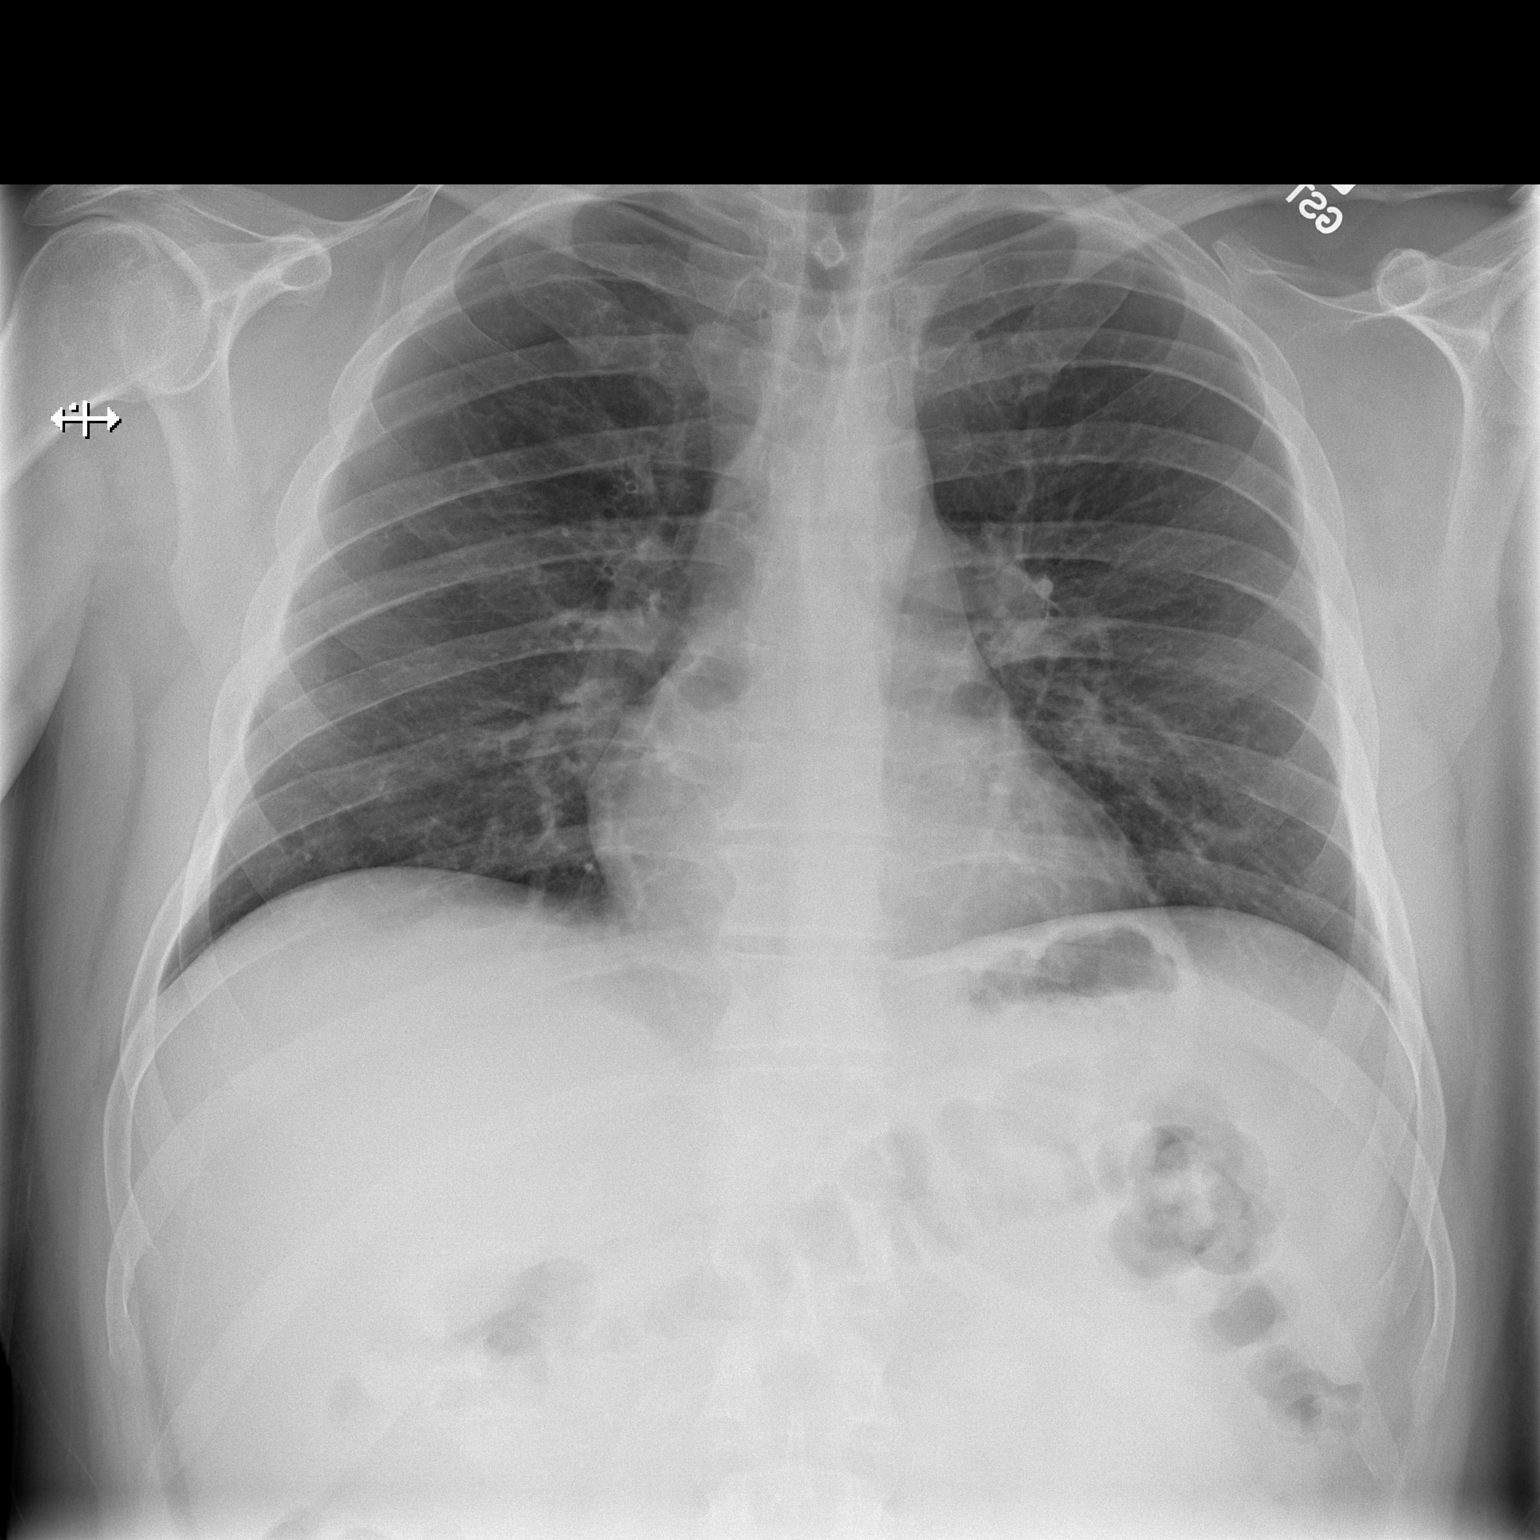

[w chest lat]
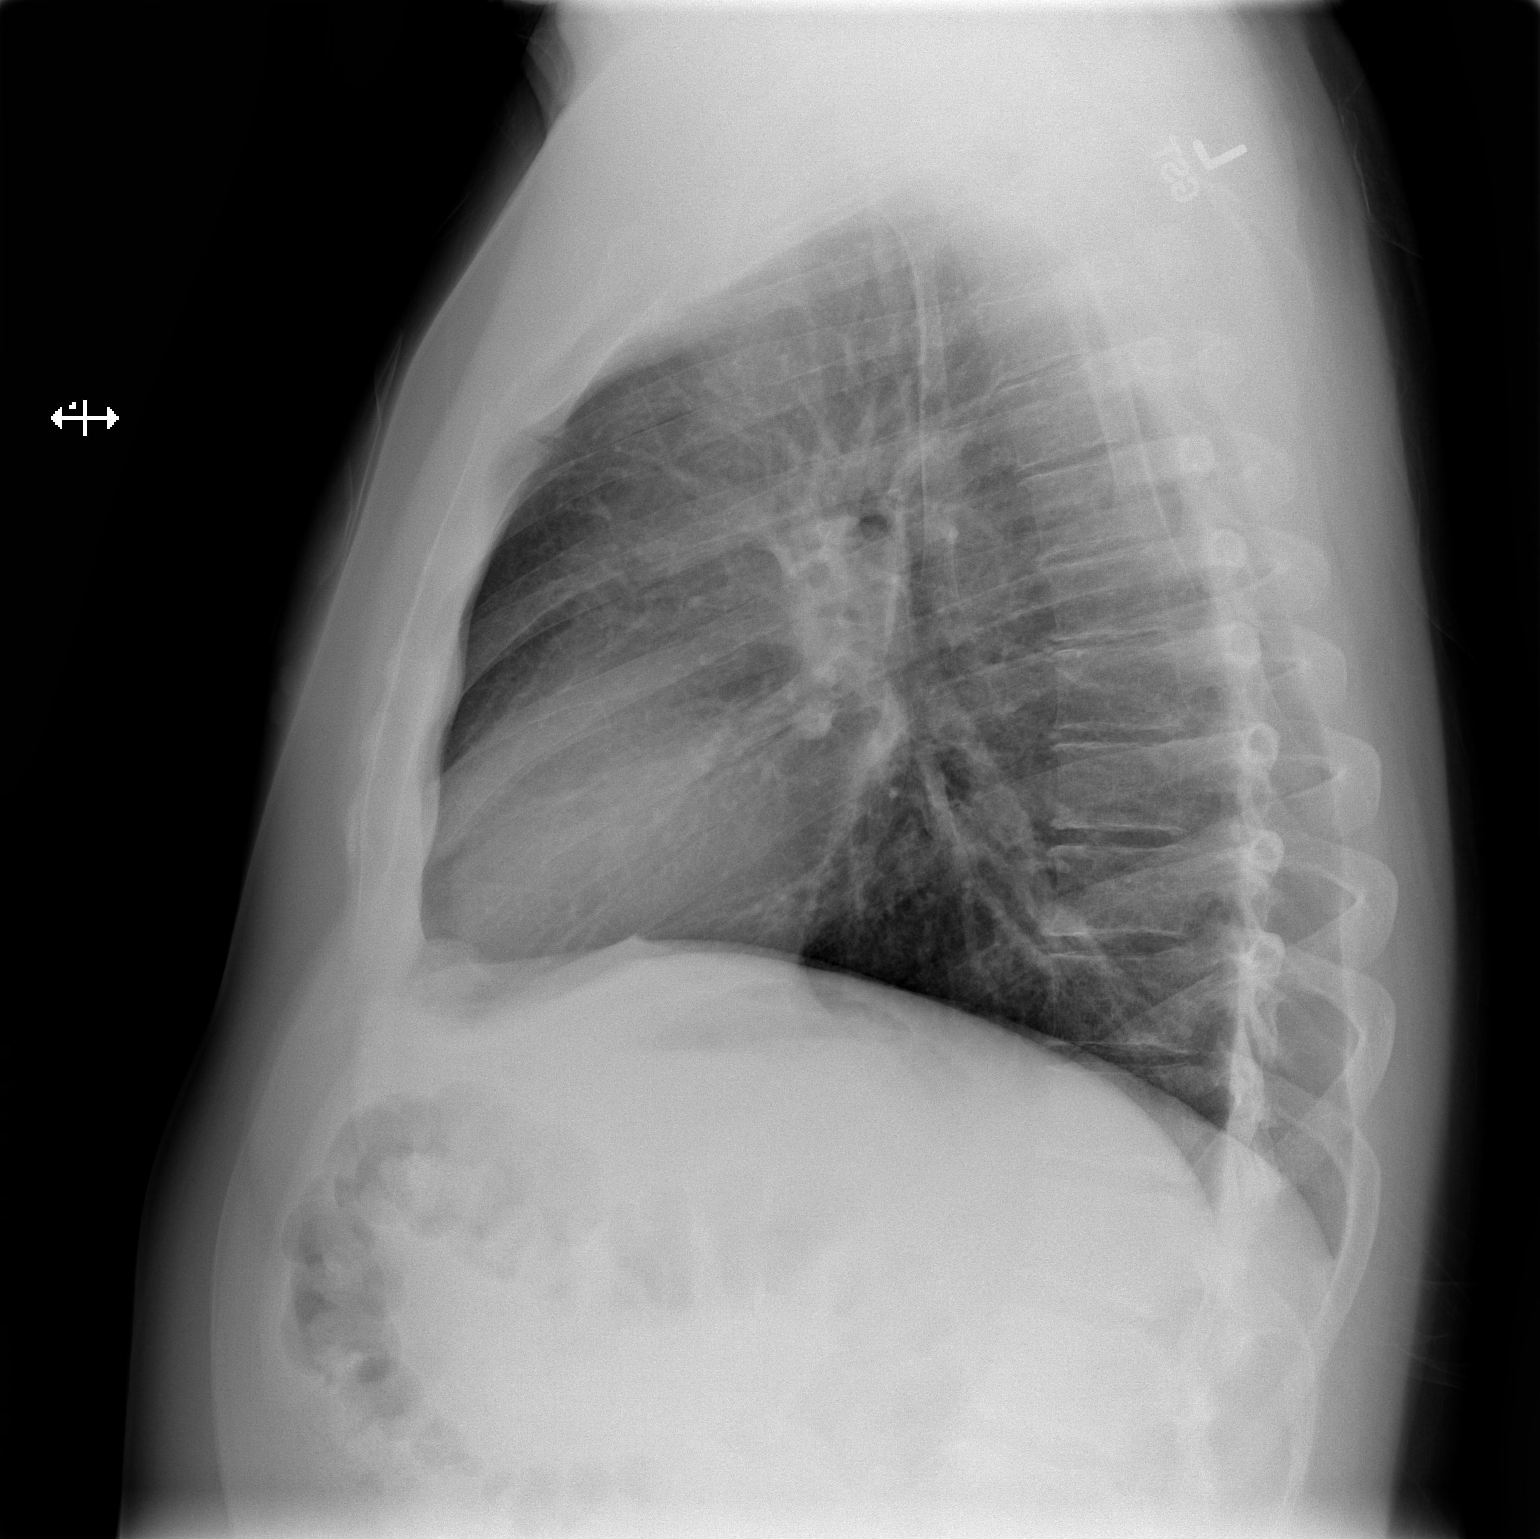

[2 of 2 positions shown; findings below may reference images not displayed]

FINDINGS: Lung volumes are normal.  No consolidative airspace
disease.  No pleural effusions.  No pneumothorax.  No pulmonary
nodule or mass noted.  Pulmonary vasculature and the
cardiomediastinal silhouette are within normal limits.
IMPRESSION: 1. No radiographic evidence of acute cardiopulmonary disease.

## 2015-07-17 ENCOUNTER — Encounter (HOSPITAL_COMMUNITY): Payer: Self-pay | Admitting: Emergency Medicine

## 2015-07-17 ENCOUNTER — Emergency Department (HOSPITAL_COMMUNITY)
Admission: EM | Admit: 2015-07-17 | Discharge: 2015-07-18 | Disposition: A | Discharge status: Other Acute Inpatient Hospital | Payer: Self-pay | Attending: Emergency Medicine | Admitting: Emergency Medicine

## 2015-07-17 DIAGNOSIS — S0512XA Contusion of eyeball and orbital tissues, left eye, initial encounter: Secondary | ICD-10-CM | POA: Insufficient documentation

## 2015-07-17 DIAGNOSIS — Y929 Unspecified place or not applicable: Secondary | ICD-10-CM | POA: Insufficient documentation

## 2015-07-17 DIAGNOSIS — R45851 Suicidal ideations: Secondary | ICD-10-CM

## 2015-07-17 DIAGNOSIS — F329 Major depressive disorder, single episode, unspecified: Secondary | ICD-10-CM

## 2015-07-17 DIAGNOSIS — F3130 Bipolar disorder, current episode depressed, mild or moderate severity, unspecified: Secondary | ICD-10-CM | POA: Insufficient documentation

## 2015-07-17 DIAGNOSIS — Z720 Tobacco use: Secondary | ICD-10-CM | POA: Insufficient documentation

## 2015-07-17 DIAGNOSIS — Y998 Other external cause status: Secondary | ICD-10-CM | POA: Insufficient documentation

## 2015-07-17 DIAGNOSIS — F1410 Cocaine abuse, uncomplicated: Secondary | ICD-10-CM | POA: Insufficient documentation

## 2015-07-17 DIAGNOSIS — F32A Depression, unspecified: Secondary | ICD-10-CM

## 2015-07-17 DIAGNOSIS — Y9389 Activity, other specified: Secondary | ICD-10-CM | POA: Insufficient documentation

## 2015-07-17 LAB — COMPREHENSIVE METABOLIC PANEL
ALBUMIN: 4.6 g/dL (ref 3.5–5.0)
ALT: 20 U/L (ref 17–63)
AST: 23 U/L (ref 15–41)
Alkaline Phosphatase: 71 U/L (ref 38–126)
Anion gap: 5 (ref 5–15)
BUN: 12 mg/dL (ref 6–20)
CHLORIDE: 108 mmol/L (ref 101–111)
CO2: 26 mmol/L (ref 22–32)
CREATININE: 1.05 mg/dL (ref 0.61–1.24)
Calcium: 9.2 mg/dL (ref 8.9–10.3)
GFR calc Af Amer: >60 mL/min (ref >60)
GLUCOSE: 98 mg/dL (ref 65–99)
POTASSIUM: 4 mmol/L (ref 3.5–5.1)
SODIUM: 139 mmol/L (ref 135–145)
Total Bilirubin: 0.7 mg/dL (ref 0.3–1.2)
Total Protein: 8.4 g/dL — ABNORMAL HIGH (ref 6.5–8.1)

## 2015-07-17 LAB — RAPID URINE DRUG SCREEN, HOSP PERFORMED
AMPHETAMINES: NOT DETECTED
BENZODIAZEPINES: NOT DETECTED
Barbiturates: NOT DETECTED
Cocaine: POSITIVE — AB
Opiates: NOT DETECTED
TETRAHYDROCANNABINOL: NOT DETECTED

## 2015-07-17 LAB — CBC
HCT: 46.7 % (ref 39.0–52.0)
HEMOGLOBIN: 16.1 g/dL (ref 13.0–17.0)
MCH: 30.7 pg (ref 26.0–34.0)
MCHC: 34.5 g/dL (ref 30.0–36.0)
MCV: 89 fL (ref 78.0–100.0)
PLATELETS: 285 10*3/uL (ref 150–400)
RBC: 5.25 MIL/uL (ref 4.22–5.81)
RDW: 13.5 % (ref 11.5–15.5)
WBC: 10.7 10*3/uL — AB (ref 4.0–10.5)

## 2015-07-17 LAB — LITHIUM LEVEL

## 2015-07-17 LAB — ACETAMINOPHEN LEVEL: Acetaminophen (Tylenol), Serum: <10 ug/mL — ABNORMAL LOW (ref 10–30)

## 2015-07-17 LAB — ETHANOL

## 2015-07-17 LAB — SALICYLATE LEVEL: Salicylate Lvl: <4 mg/dL (ref 2.8–30.0)

## 2015-07-17 MED ORDER — LORAZEPAM 1 MG PO TABS
1.0000 mg | ORAL_TABLET | Freq: Three times a day (TID) | ORAL | Status: DC | PRN
  Administered 2015-07-17: 1 mg via ORAL
  Filled 2015-07-17: qty 1

## 2015-07-17 MED ORDER — IBUPROFEN 200 MG PO TABS: 600.0000 mg | ORAL_TABLET | Freq: Three times a day (TID) | ORAL | Status: DC | PRN

## 2015-07-17 MED ORDER — ZOLPIDEM TARTRATE 5 MG PO TABS: 5.0000 mg | ORAL_TABLET | Freq: Every evening | ORAL | Status: DC | PRN

## 2015-07-17 MED ORDER — LORAZEPAM 1 MG PO TABS: 0.0000 mg | ORAL_TABLET | Freq: Four times a day (QID) | ORAL | Status: DC

## 2015-07-17 MED ORDER — ALUM & MAG HYDROXIDE-SIMETH 200-200-20 MG/5ML PO SUSP: 30.0000 mL | ORAL | Status: DC | PRN

## 2015-07-17 MED ORDER — LORAZEPAM 1 MG PO TABS: 0.0000 mg | ORAL_TABLET | Freq: Two times a day (BID) | ORAL | Status: DC

## 2015-07-17 MED ORDER — ONDANSETRON HCL 4 MG PO TABS: 4.0000 mg | ORAL_TABLET | Freq: Three times a day (TID) | ORAL | Status: DC | PRN

## 2015-07-17 MED ORDER — ACETAMINOPHEN 325 MG PO TABS: 650.0000 mg | ORAL_TABLET | ORAL | Status: DC | PRN
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
## 2015-07-17 NOTE — ED Notes (Signed)
Patient complains of SI without a plan. Patient denies HI and AVH at this time. Patient calm and cooperative. Patient oriented to unit. Patient given a [place] sandwich and soda. Plan of care discussed. Patient voices no complaints or concerns at this time. Encouragement and support provided and safety maintain. Q 15 min safety checks in place.
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
## 2015-07-18 ENCOUNTER — Encounter (HOSPITAL_COMMUNITY): Payer: Self-pay | Admitting: Behavioral Health

## 2015-07-18 ENCOUNTER — Inpatient Hospital Stay (HOSPITAL_COMMUNITY)
Admission: EM | Admit: 2015-07-18 | Discharge: 2015-07-24 | DRG: 885 | Disposition: A | Discharge status: Home / Self Care | Payer: No Typology Code available for payment source | Source: Intra-hospital | Attending: Psychiatry | Admitting: Psychiatry

## 2015-07-18 DIAGNOSIS — F17210 Nicotine dependence, cigarettes, uncomplicated: Secondary | ICD-10-CM | POA: Diagnosis present

## 2015-07-18 DIAGNOSIS — F1010 Alcohol abuse, uncomplicated: Secondary | ICD-10-CM

## 2015-07-18 DIAGNOSIS — G4700 Insomnia, unspecified: Secondary | ICD-10-CM | POA: Diagnosis present

## 2015-07-18 DIAGNOSIS — F1414 Cocaine abuse with cocaine-induced mood disorder: Secondary | ICD-10-CM | POA: Diagnosis not present

## 2015-07-18 DIAGNOSIS — F3162 Bipolar disorder, current episode mixed, moderate: Secondary | ICD-10-CM | POA: Diagnosis present

## 2015-07-18 DIAGNOSIS — Z8249 Family history of ischemic heart disease and other diseases of the circulatory system: Secondary | ICD-10-CM | POA: Diagnosis not present

## 2015-07-18 DIAGNOSIS — R45851 Suicidal ideations: Secondary | ICD-10-CM | POA: Diagnosis present

## 2015-07-18 DIAGNOSIS — F332 Major depressive disorder, recurrent severe without psychotic features: Secondary | ICD-10-CM | POA: Diagnosis present

## 2015-07-18 MED ORDER — LITHIUM CARBONATE ER 450 MG PO TBCR
450.0000 mg | EXTENDED_RELEASE_TABLET | Freq: Two times a day (BID) | ORAL | Status: DC
Start: 2015-07-18 — End: 2015-07-18
  Administered 2015-07-18: 450 mg via ORAL
  Filled 2015-07-18 (×4): qty 1

## 2015-07-18 MED ORDER — DIVALPROEX SODIUM ER 500 MG PO TB24: 500.0000 mg | ORAL_TABLET | Freq: Every day | ORAL | Status: DC

## 2015-07-18 MED ORDER — MAGNESIUM HYDROXIDE 400 MG/5ML PO SUSP: 30.0000 mL | Freq: Every day | ORAL | Status: DC | PRN

## 2015-07-18 MED ORDER — LITHIUM CARBONATE ER 450 MG PO TBCR
450.0000 mg | EXTENDED_RELEASE_TABLET | Freq: Two times a day (BID) | ORAL | Status: DC
  Filled 2015-07-18 (×2): qty 1

## 2015-07-18 MED ORDER — ALUM & MAG HYDROXIDE-SIMETH 200-200-20 MG/5ML PO SUSP: 30.0000 mL | ORAL | Status: DC | PRN

## 2015-07-18 MED ORDER — VITAMIN B-1 100 MG PO TABS: 100.0000 mg | ORAL_TABLET | Freq: Every day | ORAL | Status: DC

## 2015-07-18 MED ORDER — BUPROPION HCL ER (XL) 150 MG PO TB24
150.0000 mg | ORAL_TABLET | Freq: Every day | ORAL | Status: DC
Start: 2015-07-18 — End: 2015-07-18
  Filled 2015-07-18: qty 1

## 2015-07-18 MED ORDER — VITAMIN B-1 100 MG PO TABS
100.0000 mg | ORAL_TABLET | Freq: Every day | ORAL | Status: DC
  Administered 2015-07-18 – 2015-07-24 (×7): 100 mg via ORAL
  Filled 2015-07-18 (×10): qty 1

## 2015-07-18 MED ORDER — HYDROXYZINE HCL 50 MG PO TABS
50.0000 mg | ORAL_TABLET | Freq: Three times a day (TID) | ORAL | Status: DC | PRN
  Administered 2015-07-18 – 2015-07-23 (×6): 50 mg via ORAL
  Filled 2015-07-18 (×3): qty 1
  Filled 2015-07-18: qty 20
  Filled 2015-07-18 (×3): qty 1

## 2015-07-18 MED ORDER — ACETAMINOPHEN 325 MG PO TABS
650.0000 mg | ORAL_TABLET | Freq: Four times a day (QID) | ORAL | Status: DC | PRN
  Administered 2015-07-22: 650 mg via ORAL
  Filled 2015-07-18: qty 2

## 2015-07-18 MED ORDER — DIVALPROEX SODIUM ER 500 MG PO TB24
500.0000 mg | ORAL_TABLET | Freq: Every day | ORAL | Status: DC
  Administered 2015-07-18 – 2015-07-20 (×3): 500 mg via ORAL
  Filled 2015-07-18 (×5): qty 1

## 2015-07-18 MED ORDER — BUPROPION HCL ER (XL) 150 MG PO TB24
150.0000 mg | ORAL_TABLET | Freq: Every day | ORAL | Status: DC
  Administered 2015-07-18 – 2015-07-21 (×4): 150 mg via ORAL
  Filled 2015-07-18 (×6): qty 1

## 2015-07-18 MED ORDER — BACITRACIN-NEOMYCIN-POLYMYXIN 400-5-5000 EX OINT
TOPICAL_OINTMENT | Freq: Two times a day (BID) | CUTANEOUS | Status: DC
  Administered 2015-07-18: 1 via TOPICAL
  Administered 2015-07-19 – 2015-07-22 (×7): via TOPICAL
  Administered 2015-07-23: 1 via TOPICAL

## 2015-07-18 MED ORDER — NICOTINE 21 MG/24HR TD PT24
21.0000 mg | MEDICATED_PATCH | Freq: Every day | TRANSDERMAL | Status: DC
  Administered 2015-07-21 – 2015-07-23 (×2): 21 mg via TRANSDERMAL
  Filled 2015-07-18 (×9): qty 1

## 2015-07-18 NOTE — BH Assessment (Signed)
[facility] Assessment Progress Note    Tresa Endo [facility] said that patient could come to [facility] 307-1.  Attending physician will be Dr. Dub Mikes.  Pt can some after 08:00.

## 2015-07-18 NOTE — Plan of Care (Signed)
Problem: Diagnosis: Increased Risk For Suicide Attempt Goal: STG-Patient Will Report Suicidal Feelings to Staff Outcome: Progressing Pt has agreed to come to staff and verbalize if having S/I

## 2015-07-18 NOTE — ED Notes (Signed)
Pt. Admitted to [facility] Rm 307-1 per MD orders.  Report given to Rodell Perna, RN, [facility] notified for transport.
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
## 2015-07-18 NOTE — BHH Suicide Risk Assessment (Signed)
[facility] Admission Suicide Risk Assessment   Nursing information obtained from:  Patient Demographic factors:  Male, Caucasian, Low socioeconomic status, Unemployed Current Mental Status:  Suicidal ideation indicated by patient, Self-harm thoughts Loss Factors:  Financial problems / change in socioeconomic status Historical Factors:  Family history of suicide Risk Reduction Factors:  Sense of responsibility to family, Positive social support Total Time spent with patient: 45 minutes Principal Problem: <principal problem not specified> Diagnosis:   Patient Active Problem List   Diagnosis Date Noted  . MDD (major depressive disorder), recurrent severe, without psychosis (HCC) [F33.2] 07/18/2015  . Bipolar 1 disorder, mixed, moderate (HCC) [F31.62] 04/23/2013  . Medically noncompliant [Z91.19] 04/23/2013  . Polysubstance abuse [F19.10] 04/23/2013     Continued Clinical Symptoms:  Alcohol Use Disorder Identification Test Final Score (AUDIT): 18 The "Alcohol Use Disorders Identification Test", Guidelines for Use in Primary Care, Second Edition.  World Science writer [facility]). Score between 0-7:  no or low risk or alcohol related problems. Score between 8-15:  moderate risk of alcohol related problems. Score between 16-19:  high risk of alcohol related problems. Score 20 or above:  warrants further diagnostic evaluation for alcohol dependence and treatment.   CLINICAL FACTORS:   Bipolar Disorder:   Depressive phase Alcohol/Substance Abuse/Dependencies  Psychiatric Specialty Exam: Physical Exam  ROS  Blood pressure 133/73, pulse 72, temperature 98.3 F (36.8 C), temperature source Oral, resp. rate 16, height  (1.778 m), weight 105.235 kg (232 lb).Body mass index is 33.29 kg/(m^2).    COGNITIVE FEATURES THAT CONTRIBUTE TO RISK:  Closed-mindedness, Polarized thinking and Thought constriction (tunnel vision)    SUICIDE RISK:   Moderate:  Frequent suicidal ideation with limited  intensity, and duration, some specificity in terms of plans, no associated intent, good self-control, limited dysphoria/symptomatology, some risk factors present, and identifiable protective factors, including available and accessible social support.  PLAN OF CARE: See Admission H and P  Medical Decision Making:  Review of Psycho-Social Stressors (1), Review or order clinical lab tests (1), Review of Medication Regimen & Side Effects (2) and Review of New Medication or Change in Dosage (2)  I certify that inpatient services furnished can reasonably be expected to improve the patient's condition.   Vaughn Beaumier A 07/18/2015, 2:32 PM
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
## 2015-07-18 NOTE — ED Notes (Signed)
Pt. Discharged/transferred to [facility] 307-1 per MD orders.
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
## 2015-07-18 NOTE — Tx Team (Addendum)
Initial Interdisciplinary Treatment Plan   PATIENT STRESSORS: Marital or family conflict Medication change or noncompliance Occupational concerns Substance abuse   PATIENT STRENGTHS: Ability for insight Capable of independent living Motivation for treatment/growth   PROBLEM LIST: Problem List/Patient Goals Date to be addressed Date deferred Reason deferred Estimated date of resolution  "try to feel better about self" 07/18/15     "be able to enjoy life instead of not being happy with self" 07/18/15     Depression      Suicidal Ideation      Substance abuse                               DISCHARGE CRITERIA:  Ability to meet basic life and health needs Adequate post-discharge living arrangements Improved stabilization in mood, thinking, and/or behavior  PRELIMINARY DISCHARGE PLAN: Attend aftercare/continuing care group Attend PHP/IOP  PATIENT/FAMIILY INVOLVEMENT: This treatment plan has been presented to and reviewed with the patient, Cody Osborne, and/or family member. The patient and family have been given the opportunity to ask questions and make suggestions.  Osborne, Cody L 07/18/2015, 10:35 AM

## 2015-07-18 NOTE — BHH Group Notes (Signed)
BHH LCSW Group Therapy  07/18/2015 12:10 PM  Type of Therapy:  Group Therapy  Participation Level:  Did Not Attend-pt new to unit. Chose to remain in bed. Invited.   Modes of Intervention:  Confrontation, Discussion, Education, Exploration, Problem-solving, Rapport Building, Socialization and Support  Summary of Progress/Problems: Feelings around Relapse. Group members discussed the meaning of relapse and shared personal stories of relapse, how it affected them and others, and how they perceived themselves during this time. Group members were encouraged to identify triggers, warning signs and coping skills used when facing the possibility of relapse. Social supports were discussed and explored in detail. Post Acute Withdrawal Syndrome (handout provided) was introduced and examined. Pt's were encouraged to ask questions, talk about key points associated with PAWS, and process this information in terms of relapse prevention.   Smart, Lavora Brisbon LCSWA  07/18/2015, 12:10 PM
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
## 2015-07-19 DIAGNOSIS — F3162 Bipolar disorder, current episode mixed, moderate: Principal | ICD-10-CM

## 2015-07-19 LAB — LIPID PANEL
CHOL/HDL RATIO: 4.6 ratio
CHOLESTEROL: 224 mg/dL — AB (ref 0–200)
HDL: 49 mg/dL (ref >40)
LDL Cholesterol: 153 mg/dL — ABNORMAL HIGH (ref 0–99)
Triglycerides: 112 mg/dL (ref <150)
VLDL: 22 mg/dL (ref 0–40)

## 2015-07-19 LAB — TSH: TSH: 3.532 u[IU]/mL (ref 0.350–4.500)

## 2015-07-19 NOTE — [facility] Group Notes (Signed)
[facility] Group Notes: (Clinical Social Work)   07/19/2015      Type of Therapy:  Group Therapy   Participation Level:  Did Not Attend despite MHT prompting   Averly Ericson Grossman-Orr, LCSW 07/19/2015, 11:05 AM     
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
## 2015-07-19 NOTE — Progress Notes (Signed)
Patient ID: Cody Osborne, male   DOB: November 19, 1970, 44 y.o.   MRN: [id number]   D: Pt was very flat and depressed on the unit today. Pt remained in the bed most of the day, he did not attend any groups nor did he engage in any treatment. Pt reported that his depression was a 6, his hopelessness was a 5, and his anxiety was a 7. Pt reported that his goal was to be positive. Pt reported being negative SI/HI, no AH/VH noted. A: 15 min checks continued for patient safety. R: Pt safety maintained.

## 2015-07-19 NOTE — Progress Notes (Signed)
D.  Pt pleasant on approach, no acute distress noted.  Visible and interacting within the milieu, no complaints voiced.  Pt feels that he just needs a little bit of time to pick himself up and regain his bearings.  Denies SI/HI/hallucinations at this time.  Positive for evening wrap up group.  A.  Support and encouragement offered  R.  Pt remains safe on the unit, will continue to monitor.

## 2015-07-20 NOTE — Progress Notes (Signed)
[facility] MD Progress Note  07/20/2015  Cody Osborne  MRN:  [id number] Subjective:  Pt states: "I feel a lot better today. My eye still hurts and I've bene using some ice."   Objective: Pt seen and chart reviewed. Pt is alert/oriented x4,m calm, cooperative and appropriate to situation. Pt reports that he is feeling much better today with less depression and anxiety. He reports that he is participating in group more often as well. He continues to deny suicidal/homicidal ideation and psychosis and does not appear to be responding to internal stimuli.   Principal Problem: Bipolar 1 disorder, mixed, moderate (HCC) Diagnosis:   Patient Active Problem List   Diagnosis Date Noted  . Cocaine abuse with cocaine-induced mood disorder [facility]) [F14.14] 07/18/2015    Priority: High  . Bipolar 1 disorder, mixed, moderate (HCC) [F31.62] 04/23/2013    Priority: High  . Medically noncompliant [Z91.19] 04/23/2013  . Polysubstance abuse [F19.10] 04/23/2013   Total Time spent with patient: 15 minutes  Past Psychiatric History: See H&P  Past Medical History:  Past Medical History  Diagnosis Date  . Bipolar 1 disorder (HCC)   . Depression   . Alcohol abuse   . Substance abuse    History reviewed. No pertinent past surgical history. Family History:  Family History  Problem Relation Age of Onset  . Heart attack Father   . Heart attack Brother    Family Psychiatric  History: See H&P Social History:  History  Alcohol Use No    Comment: former      History  Drug Use No    Comment: former    Social History   Social History  . Marital Status: Single    Spouse Name: N/A  . Number of Children: N/A  . Years of Education: N/A   Social History Main Topics  . Smoking status: Current Some Day Smoker -- 0.50 packs/day for 8 years    Types: Cigarettes  . Smokeless tobacco: None  . Alcohol Use: No     Comment: former   . Drug Use: No     Comment: former  . Sexual Activity: No   Other Topics Concern   . None   Social History Narrative   Additional Social History:                         Sleep: fair yet improving  Appetite:  Good  Current Medications: Current Facility-Administered Medications  Medication Dose Route Frequency Provider Last Rate Last Dose  . acetaminophen (TYLENOL) tablet 650 mg  650 mg Oral Q6H PRN Beau Fanny, FNP      . alum & mag hydroxide-simeth (MAALOX/MYLANTA) 200-200-20 MG/5ML suspension 30 mL  30 mL Oral Q4H PRN Beau Fanny, FNP      . buPROPion (WELLBUTRIN XL) 24 hr tablet 150 mg  150 mg Oral Daily Beau Fanny, FNP   150 mg at 07/20/15 0735  . divalproex (DEPAKOTE ER) 24 hr tablet 500 mg  500 mg Oral QHS Rachael Fee, MD   500 mg at 07/19/15 2132  . hydrOXYzine (ATARAX/VISTARIL) tablet 50 mg  50 mg Oral TID PRN Worthy Flank, NP   50 mg at 07/19/15 2132  . magnesium hydroxide (MILK OF MAGNESIA) suspension 30 mL  30 mL Oral Daily PRN Beau Fanny, FNP      . neomycin-bacitracin-polymyxin (NEOSPORIN) ointment   Topical BID Rachael Fee, MD      . nicotine (NICODERM  CQ - dosed in mg/24 hours) patch 21 mg  21 mg Transdermal Daily Rachael Fee, MD   21 mg at 07/19/15 0813  . thiamine (VITAMIN B-1) tablet 100 mg  100 mg Oral Daily Beau Fanny, FNP   100 mg at 07/20/15 1610    Lab Results:  Results for orders placed or performed during the hospital encounter of 07/18/15 (from the past 48 hour(s))  Lipid panel     Status: Abnormal   Collection Time: 07/19/15  6:46 AM  Result Value Ref Range   Cholesterol 224 (H) 0 - 200 mg/dL   Triglycerides 960 <454 mg/dL   HDL 49 >09 mg/dL   Total CHOL/HDL Ratio 4.6 RATIO   VLDL 22 0 - 40 mg/dL   LDL Cholesterol 811 (H) 0 - 99 mg/dL    Comment:        Total Cholesterol/HDL:CHD Risk Coronary Heart Disease Risk Table                     Men   Women  1/2 Average Risk   3.4   3.3  Average Risk       5.0   4.4  2 X Average Risk   9.6   7.1  3 X Average Risk  23.4   11.0        Use the  calculated Patient Ratio above and the CHD Risk Table to determine the patient's CHD Risk.        ATP III CLASSIFICATION (LDL):  <100     mg/dL   Optimal  914-782  mg/dL   Near or Above                    Optimal  130-159  mg/dL   Borderline  956-213  mg/dL   High  >086     mg/dL   Very High Performed at [facility]   TSH     Status: None   Collection Time: 07/19/15  6:46 AM  Result Value Ref Range   TSH 3.532 0.350 - 4.500 uIU/mL    Comment: Performed at [facility]    Physical Findings: AIMS: Facial and Oral Movements Muscles of Facial Expression: None, normal Lips and Perioral Area: None, normal Jaw: None, normal Tongue: None, normal,Extremity Movements Upper (arms, wrists, hands, fingers): None, normal Lower (legs, knees, ankles, toes): None, normal, Trunk Movements Neck, shoulders, hips: None, normal, Overall Severity Severity of abnormal movements (highest score from questions above): None, normal Incapacitation due to abnormal movements: None, normal Patient's awareness of abnormal movements (rate only patient's report): No Awareness, Dental Status Current problems with teeth and/or dentures?: No Does patient usually wear dentures?: No  CIWA:  CIWA-Ar Total: 2 COWS:     Musculoskeletal: Strength & Muscle Tone: within normal limits Gait & Station: normal Patient leans: N/A  Psychiatric Specialty Exam: Review of Systems  Psychiatric/Behavioral: Positive for depression and substance abuse. Negative for suicidal ideas and hallucinations. The patient is nervous/anxious. The patient does not have insomnia.   All other systems reviewed and are negative.   Blood pressure 123/101, pulse 87, temperature 97.9 F (36.6 C), temperature source Oral, resp. rate 16, height 5\' 10"  (1.778 m), weight 105.235 kg (232 lb).Body mass index is 33.29 kg/(m^2).  General Appearance: Casual and Fairly Groomed  Eye Contact::  Good  Speech:  Clear and Coherent  and Normal Rate  Volume:  Normal  Mood:  Anxious and Depressed  Affect:  Appropriate, Congruent and Depressed  Thought Process:  Coherent and Goal Directed  Orientation:  Full (Time, Place, and Person)  Thought Content:  WDL  Suicidal Thoughts:  No  Homicidal Thoughts:  No  Memory:  Immediate;   Fair Recent;   Fair Remote;   Fair  Judgement:  Fair  Insight:  Fair  Psychomotor Activity:  Normal  Concentration:  Fair  Recall:  [employer] of Knowledge:Fair  Language: Fair  Akathisia:  NA  Handed:    AIMS (if indicated):     Assets:  Communication Skills Desire for Improvement Resilience Social Support  ADL's:  Intact  Cognition: WNL  Sleep:  Number of Hours: 6.25   *I have reviewed and concur with treatment plan below as follows on 07/20/15:   Treatment Plan Summary: Daily contact with patient to assess and evaluate symptoms and progress in treatment and Medication management  Medications:  -Continue Wellbutrin XL  daily for MDD -Continue Depakote  qhs for mood stabilization -Continue vistaril  tid prn anxiety  -Continue nicotine patches     Jalesia Loudenslager, Everardo All, FNP-BC 07/20/2015, 11:05 AM

## 2015-07-20 NOTE — [facility] Group Notes (Signed)
[facility] Group Notes:  (Nursing/MHT/Case Management/Adjunct)  Date:  07/20/2015  Time:  10:39 AM  Type of Therapy:  Psychoeducational Skills  Participation Level:  Did Not Attend  Participation Quality:  Did Not Attend  Affect:  Did Not Attend  Cognitive:  Did Not Attend  Insight:  None  Engagement in Group:  Did Not Attend  Modes of Intervention:  Did Not Attend  Summary of Progress/Problems: Pt did not attend patient self inventory group.   Jacquelyne Balint Shanta 07/20/2015, 10:39 AM

## 2015-07-20 NOTE — Progress Notes (Signed)
Psychoeducational Group Note  Date:  07/20/2015 Time:  2309  Group Topic/Focus:  Wrap-Up Group:   The focus of this group is to help patients review their daily goal of treatment and discuss progress on daily workbooks.  Participation Level: Did Not Attend  Participation Quality:  Not Applicable  Affect:  Not Applicable  Cognitive:  Not Applicable  Insight:  Not Applicable  Engagement in Group: Not Applicable  Additional Comments:  The patient refused to attend the A.A. Meeting since he was not feeling well.   Cyera Balboni S 07/20/2015, 11:08 PM

## 2015-07-20 NOTE — Progress Notes (Signed)
Patient ID: Cody Osborne, male   DOB: October 31, 1970, 44 y.o.   MRN: [id number]   D: Pt has been very flat and depressed on the unit today, he did not attend any groups nor did he engage in any treatment. Pt reported that he was feeling much better today. Pt reported that his depression was a 5, his hopelessness was a 6, and his anxiety was a 5. Pt reported that his goal was to simplify his problems. Pt reported being negative SI/HI, no AH/VH noted. A: 15 min checks continued for patient safety. R: Pt safety maintained.

## 2015-07-20 NOTE — Progress Notes (Signed)
D.  Pt pleasant on approach, denies complaints at  this time other than continued depression.  Pt denies SI/HI/hallucinations at this time.  Pt did not attend evening AA group.  Interacting appropriately with peers on the unit.  A.  Support and encouragement offered  R.  Pt remains safe on unit, will continue to monitor.

## 2015-07-20 NOTE — [facility] Group Notes (Signed)
[facility] Group Notes: (Clinical Social Work)   07/20/2015      Type of Therapy:  Group Therapy   Participation Level:  Did Not Attend despite MHT prompting   Nevena Rozenberg Grossman-Orr, LCSW 07/20/2015, 11:13 AM     

## 2015-07-20 NOTE — Progress Notes (Signed)
[facility] Group Notes:  (Nursing/MHT/Case Management/Adjunct)  Date:  07/20/2015  Time:  12:24 AM  Type of Therapy:  Psychoeducational Skills  Participation Level:  Active  Participation Quality:  Appropriate  Affect:  Appropriate  Cognitive:  Appropriate  Insight:  Improving  Engagement in Group:  Developing/Improving  Modes of Intervention:  Education  Summary of Progress/Problems: The patient described his day as having been "fun" since he was able to sit with his peers and watch television. Nothing else was shared about his day. The patient's goal for tomorrow is to work on his depression.   Cody Osborne S 07/20/2015, 12:24 AM

## 2015-07-20 NOTE — Plan of Care (Signed)
Problem: Alteration in mood & ability to function due to Goal: STG-Patient will attend groups Outcome: Not Progressing Pt did not attend evening AA group     

## 2015-07-20 NOTE — [facility] Group Notes (Signed)
[facility] Group Notes:  (Nursing/MHT/Case Management/Adjunct)  Date:  07/20/2015  Time:  11:06 AM  Type of Therapy:  Psychoeducational Skills  Participation Level:  Did Not Attend  Participation Quality:  Did Not Attend  Affect:  Did Not Attend  Cognitive:  Did Not Attend  Insight:  None  Engagement in Group:  Did Not Attend  Modes of Intervention:  Did Not Attend  Summary of Progress/Problems: Pt did not attend patient self inventory group.   Anquinette Pierro Shanta 07/20/2015, 11:06 AM 

## 2015-07-21 DIAGNOSIS — R45851 Suicidal ideations: Secondary | ICD-10-CM

## 2015-07-21 LAB — HEMOGLOBIN A1C
HEMOGLOBIN A1C: 5.3 % (ref 4.8–5.6)
MEAN PLASMA GLUCOSE: 105 mg/dL

## 2015-07-21 MED ORDER — DIVALPROEX SODIUM ER 250 MG PO TB24
750.0000 mg | ORAL_TABLET | Freq: Every day | ORAL | Status: DC
  Administered 2015-07-21 – 2015-07-22 (×2): 750 mg via ORAL
  Filled 2015-07-21 (×4): qty 3

## 2015-07-21 MED ORDER — BUPROPION HCL ER (XL) 300 MG PO TB24
300.0000 mg | ORAL_TABLET | Freq: Every day | ORAL | Status: DC
  Administered 2015-07-22 – 2015-07-24 (×3): 300 mg via ORAL
  Filled 2015-07-21: qty 1
  Filled 2015-07-21 (×2): qty 7
  Filled 2015-07-21 (×3): qty 1

## 2015-07-21 NOTE — [facility] Group Notes (Signed)
[facility] LCSW Group Therapy 07/21/2015  1:15 PM   Type of Therapy: Group Therapy  Participation Level: Did Not Attend. Patient invited to participate but declined.   Tristram Milian, MSW, LCSWA Clinical Social Worker [facility] [phone]   

## 2015-07-21 NOTE — [facility] Group Notes (Signed)
   [facility] LCSW Aftercare Discharge Planning Group Note  07/21/2015  8:45 AM   Participation Quality: Alert, Appropriate and Oriented  Mood/Affect: Depressed and Flat  Depression Rating: 5  Anxiety Rating: 5  Thoughts of Suicide: Pt denies SI/HI  Will you contract for safety? Yes  Current AVH: Pt denies  Plan for Discharge/Comments: Pt attended discharge planning group and actively participated in group. CSW provided pt with today's workbook. Patient reports being homeless and has been to [facility] in the past.   Transportation Means: Pt reports access to transportation  Supports: No supports mentioned at this time  Samuella Bruin, MSW, [employer] Clinical Social Worker [employer] [phone]

## 2015-07-21 NOTE — Progress Notes (Signed)
Adult Psychoeducational Group Note  Date:  07/21/2015 Time:  10:31 PM  Group Topic/Focus:  Wrap-Up Group:   The focus of this group is to help patients review their daily goal of treatment and discuss progress on daily workbooks.  Participation Level:  Active  Participation Quality:  Appropriate  Affect:  Appropriate  Cognitive:  Appropriate  Insight: Appropriate  Engagement in Group:  Engaged  Modes of Intervention:  Discussion  Additional Comments:  Pt goal was not to worry much and quit stressing over situations he cant control.  Bernadene Person H 07/21/2015, 10:31 PM

## 2015-07-21 NOTE — Progress Notes (Signed)
DAR Note: Cody Osborne has been in his room much of the day.  He attended AM group.  He denies SI/HI ideation or A/V hallucinations. He states that he is worried about where he is going to be living when he is discharged.  He has the option to move back in with girlfriend but states that he isn't sure that is a good idea.  Talked with him abut his options and urged him to make a pros and cons list.  Also encouraged journaling.  Gave him a journal to try out the pro/con list.  He didn't complete his self inventory.  Encouraged him to participate in group and unit activities.  He appears to be in no physical distress.  Q 15 minute checks maintained for safety.  We will continue to monitor the progress towards his goals.
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
## 2015-07-21 NOTE — [facility] Suicide Risk Assessment (Signed)
[facility] INPATIENT:  Family/Significant Other Suicide Prevention Education  Suicide Prevention Education:  Patient provided CSW with permission to contact his mother Melesio Madara for SPE. CSW unable to contact mother due to collateral information provided by patient's girlfriend that mother has a restraining order against patient. SPE reviewed with patient and brochure provided. Patient encouraged to return to hospital if having suicidal thoughts, patient verbalized his/her understanding and has no further questions at this time.   Samuella Bruin, MSW, [employer] Clinical Social Worker [facility] [phone]

## 2015-07-22 NOTE — [facility] Group Notes (Signed)
[facility] Group Notes:  (Nursing/MHT/Case Management/Adjunct)  Date:  07/22/2015  Time:  2:01 PM  Type of Therapy:  Nurse Education  Participation Level:  Did Not Attend  Participation Quality:    Affect:    Cognitive:    Insight:    Engagement in Group:    Modes of Intervention:  Discussion and Education  Summary of Progress/Problems:  Group topic was Recovery.  Discussed setting appropriate/measurable goals.  Cody Osborne did not attend group after much encouragement.   Cody Osborne Cody Osborne 07/22/2015, 2:01 PM

## 2015-07-22 NOTE — Progress Notes (Signed)
DAR Note: Cody Osborne has been in his room much of the day.  He has declined coming to groups even with much encouragement.  He states that he "just wants to rest."  He denies any SI/HI or A/V hallucinations.  He reported left eye pain (3/10)and tylenol was given with good relief.  He completed his self inventory and reports that his depression is 4/10, hopelessness 3/10 and anxiety 6/10.  He states that his goal for today is to "work on my confidence and not be so negative."  He appears to be in no physical distress.  Q 15 minute checks maintained for safety.  Continued to encourage participation in group/unit activities.

## 2015-07-22 NOTE — Progress Notes (Signed)
Recreation Therapy Notes  Animal-Assisted Activity (AAA) Program Checklist/Progress Notes Patient Eligibility Criteria Checklist & Daily Group note for Rec Tx Intervention  Date: 10.11.2016 Time: 2:45pm Location: [address] [employer]    AAA/T Program Assumption of Risk Form signed by Patient/ or Parent Legal Guardian yes  Patient is free of allergies or sever asthma yes  Patient reports no fear of animals yes  Patient reports no history of cruelty to animals yes  Patient understands his/her participation is voluntary yes  Behavioral Response:Did not attend.   Marykay Lex Djimon Lundstrom, LRT/CTRS  Ruben Mahler L 07/22/2015 2:59 PM

## 2015-07-22 NOTE — Progress Notes (Signed)
[facility] MD Progress Note  07/22/2015 5:12 PM Cody Osborne  MRN:  [id number] Subjective:  Still feeling depressed and still feeling overwhelmed. He still feels his mood is not where it needs to be.Admits he knows that some of what is going on with him is his not knowing what is going to happen once he gets out of here. He still hopes things with her GF are going to get better and they will again be together ( contrary to what he has heard from her, she is planning to get a restraining order) he admits he gets very down gets to feel hopeless and that is when when he starts feeling he might as well be dead.  Principal Problem: Bipolar 1 disorder, mixed, moderate (HCC) Diagnosis:   Patient Active Problem List   Diagnosis Date Noted  . Cocaine abuse with cocaine-induced mood disorder (HCC) [F14.14] 07/18/2015  . Bipolar 1 disorder, mixed, moderate (HCC) [F31.62] 04/23/2013  . Medically noncompliant [Z91.19] 04/23/2013  . Polysubstance abuse [F19.10] 04/23/2013   Total Time spent with patient: 30 minutes  Past Psychiatric History: see Admission H and P  Past Medical History:  Past Medical History  Diagnosis Date  . Bipolar 1 disorder (HCC)   . Depression   . Alcohol abuse   . Substance abuse    History reviewed. No pertinent past surgical history. Family History:  Family History  Problem Relation Age of Onset  . Heart attack Father   . Heart attack Brother    Family Psychiatric  History: See Admission H and P Social History:  History  Alcohol Use No    Comment: former      History  Drug Use No    Comment: former    Social History   Social History  . Marital Status: Single    Spouse Name: N/A  . Number of Children: N/A  . Years of Education: N/A   Social History Main Topics  . Smoking status: Current Some Day Smoker -- 0.50 packs/day for 8 years    Types: Cigarettes  . Smokeless tobacco: None  . Alcohol Use: No     Comment: former   . Drug Use: No     Comment: former  .  Sexual Activity: No   Other Topics Concern  . None   Social History Narrative   Additional Social History:                         Sleep: Fair  Appetite:  Fair  Current Medications: Current Facility-Administered Medications  Medication Dose Route Frequency Provider Last Rate Last Dose  . acetaminophen (TYLENOL) tablet 650 mg  650 mg Oral Q6H PRN Beau Fanny, FNP   650 mg at 07/22/15 5621  . alum & mag hydroxide-simeth (MAALOX/MYLANTA) 200-200-20 MG/5ML suspension 30 mL  30 mL Oral Q4H PRN Beau Fanny, FNP      . buPROPion (WELLBUTRIN XL) 24 hr tablet 300 mg  300 mg Oral Daily Rachael Fee, MD   300 mg at 07/22/15 3086  . divalproex (DEPAKOTE ER) 24 hr tablet 750 mg  750 mg Oral QHS Rachael Fee, MD   750 mg at 07/21/15 2149  . hydrOXYzine (ATARAX/VISTARIL) tablet 50 mg  50 mg Oral TID PRN Worthy Flank, NP   50 mg at 07/21/15 2357  . magnesium hydroxide (MILK OF MAGNESIA) suspension 30 mL  30 mL Oral Daily PRN Beau Fanny, FNP      .  neomycin-bacitracin-polymyxin (NEOSPORIN) ointment   Topical BID Rachael Fee, MD      . nicotine (NICODERM CQ - dosed in mg/24 hours) patch 21 mg  21 mg Transdermal Daily Rachael Fee, MD   21 mg at 07/21/15 0804  . thiamine (VITAMIN B-1) tablet 100 mg  100 mg Oral Daily Beau Fanny, FNP   100 mg at 07/22/15 4540    Lab Results: No results found for this or any previous visit (from the past 48 hour(s)).  Physical Findings: AIMS: Facial and Oral Movements Muscles of Facial Expression: None, normal Lips and Perioral Area: None, normal Jaw: None, normal Tongue: None, normal,Extremity Movements Upper (arms, wrists, hands, fingers): None, normal Lower (legs, knees, ankles, toes): None, normal, Trunk Movements Neck, shoulders, hips: None, normal, Overall Severity Severity of abnormal movements (highest score from questions above): None, normal Incapacitation due to abnormal movements: None, normal Patient's awareness of  abnormal movements (rate only patient's report): No Awareness, Dental Status Current problems with teeth and/or dentures?: No Does patient usually wear dentures?: No  CIWA:  CIWA-Ar Total: 2 COWS:     Musculoskeletal: Strength & Muscle Tone: within normal limits Gait & Station: normal Patient leans: normal  Psychiatric Specialty Exam: Review of Systems  Constitutional: Negative.   HENT: Negative.   Eyes: Negative.   Respiratory: Negative.   Cardiovascular: Negative.   Gastrointestinal: Negative.   Genitourinary: Negative.   Musculoskeletal: Negative.   Skin: Negative.   Neurological: Negative.   Endo/Heme/Allergies: Negative.   Psychiatric/Behavioral: Positive for depression and substance abuse. The patient is nervous/anxious.     Blood pressure 130/80, pulse 87, temperature 97.9 F (36.6 C), temperature source Oral, resp. rate 16, height  (1.778 m), weight 105.235 kg (232 lb).Body mass index is 33.29 kg/(m^2).  General Appearance: Fairly Groomed  Patent attorney::  Fair  Speech:  Clear and Coherent  Volume:  fluctuates  Mood:  Anxious and Depressed  Affect:  Restricted  Thought Process:  Coherent and Goal Directed  Orientation:  Full (Time, Place, and Person)  Thought Content:  symptoms events worries concerns  Suicidal Thoughts:  No  Homicidal Thoughts:  No  Memory:  Immediate;   Fair Recent;   Fair Remote;   Fair  Judgement:  Fair  Insight:  Present and Shallow  Psychomotor Activity:  Decreased  Concentration:  Fair  Recall:  [employer] of Knowledge:Fair  Language: Fair  Akathisia:  No  Handed:  Right  AIMS (if indicated):     Assets:  Desire for Improvement  ADL's:  Intact  Cognition: WNL  Sleep:  Number of Hours: 6   Treatment Plan Summary: Daily contact with patient to assess and evaluate symptoms and progress in treatment and Medication management Supportive approach/coping skills Polysubstance abuse; work a relapse prevention plan Depression;  Wellbutrin was just increased to 300 mg in AM will reassess Mood instability; will continue the Depakote at 750 mg and get a Depakote level Use CBT/mindfulness Help process the loss of the relationship Explore placement options Cody Osborne A 07/22/2015, 5:12 PM

## 2015-07-22 NOTE — [facility] Group Notes (Signed)
[facility] LCSW Group Therapy 07/22/2015  1:15 PM   Type of Therapy: Group Therapy  Participation Level: Did Not Attend. Patient invited to participate but declined.   Samuella Bruin, MSW, [employer] Clinical Social Worker [facility] [phone]

## 2015-07-22 NOTE — Plan of Care (Signed)
Problem: Ineffective individual coping Goal: STG: Patient will remain free from self harm Outcome: Progressing No reports of suicidal thoughts today.

## 2015-07-22 NOTE — Progress Notes (Signed)
Pt attended Group and Pt was appropriate.  

## 2015-07-23 MED ORDER — DIVALPROEX SODIUM ER 500 MG PO TB24
1000.0000 mg | ORAL_TABLET | Freq: Every day | ORAL | Status: DC
  Administered 2015-07-23: 1000 mg via ORAL
  Filled 2015-07-23 (×2): qty 14
  Filled 2015-07-23 (×2): qty 2

## 2015-07-23 NOTE — Progress Notes (Signed)
Recreation Therapy Notes  Date: 10.12.2016 Time: 9:30am Location: [address] Group Room   Group Topic: Stress Management  Goal Area(s) Addresses:  Patient will actively participate in stress management techniques presented during session.   Behavioral Response: Did not attend.  Judia Arnott L Newt Levingston, LRT/CTRS        Zylee Marchiano L 07/23/2015 1:03 PM 

## 2015-07-23 NOTE — Progress Notes (Signed)
D: Patient is alert and oriented x 4. Patient denies pain/SI/HI/AVH. Patient reports he is feeling depressed. Patient states he was in bed all day sleeping off and on, and when not sleeping thinking about how he got here. This Clinical research associatewriter spoke with patient about setting a short term goal of staying out of bed tomorrow after breakfast Patient aggred to try that.  A: Staff to monitor Q 15 mins for safety. Encouragement and support offered. Scheduled medications administered per orders. R: Patient remains safe on the unit. Patient attended group tonight. Patient visible on hte unit and interacting with peers. Patient taking administered medications.

## 2015-07-23 NOTE — Progress Notes (Signed)
D- Patient is pleasant and anxious. Currently denies SI/HI/AVH and pain.  Patient reports plan to go to shelter on discharge and continue to look for a job.  Patient rates his depression "4/10", feelings of hopelessness "3/10", and anxiety "3/10" with 10 being the worst.  Patient's goal for today is to meditate and be positive. No complaints.  A- Scheduled medications administered to patient, per MD orders. Support and encouragement provided.  Routine safety checks conducted every 15 minutes.  Patient informed to notify staff with problems or concerns. R- No adverse drug reactions noted. Patient contracts for safety at this time. Patient compliant with medications and treatment plan. Patient receptive, calm, and cooperative. Patient interacts well with others on the unit.  Patient remains safe at this time.

## 2015-07-23 NOTE — Progress Notes (Signed)
[facility] MD Progress Note  07/23/2015 6:26 PM Cody Osborne  MRN:  [id number] Subjective:  Cody Osborne is still worried about what will happen once he is D/C tomorrow. He is not sure what is going to happen between his GF and him although he has heard she does not want to be with him. He wants to be stable in order to face what he has to face outside. He feels comfortable with the Depakote and the Wellbutrin. He was a little worried when he was taking the Lithium and he felt it was not working as well anymore. He is planning to get to [facility] early tomorrow and work on getting a bed at the shelter Principal Problem: Bipolar 1 disorder, mixed, moderate (HCC) Diagnosis:   Patient Active Problem List   Diagnosis Date Noted  . Cocaine abuse with cocaine-induced mood disorder (HCC) [F14.14] 07/18/2015  . Bipolar 1 disorder, mixed, moderate (HCC) [F31.62] 04/23/2013  . Medically noncompliant [Z91.19] 04/23/2013  . Polysubstance abuse [F19.10] 04/23/2013   Total Time spent with patient: 30 minutes  Past Psychiatric History: see Admission H and P  Past Medical History:  Past Medical History  Diagnosis Date  . Bipolar 1 disorder (HCC)   . Depression   . Alcohol abuse   . Substance abuse    History reviewed. No pertinent past surgical history. Family History:  Family History  Problem Relation Age of Onset  . Heart attack Father   . Heart attack Brother    Family Psychiatric  History: see Admission H and P Social History:  History  Alcohol Use No    Comment: former      History  Drug Use No    Comment: former    Social History   Social History  . Marital Status: Single    Spouse Name: N/A  . Number of Children: N/A  . Years of Education: N/A   Social History Main Topics  . Smoking status: Current Some Day Smoker -- 0.50 packs/day for 8 years    Types: Cigarettes  . Smokeless tobacco: None  . Alcohol Use: No     Comment: former   . Drug Use: No     Comment: former  . Sexual Activity:  No   Other Topics Concern  . None   Social History Narrative   Additional Social History:                         Sleep: Fair  Appetite:  Fair  Current Medications: Current Facility-Administered Medications  Medication Dose Route Frequency Provider Last Rate Last Dose  . acetaminophen (TYLENOL) tablet 650 mg  650 mg Oral Q6H PRN Beau Fanny, FNP   650 mg at 07/22/15 3664  . alum & mag hydroxide-simeth (MAALOX/MYLANTA) 200-200-20 MG/5ML suspension 30 mL  30 mL Oral Q4H PRN Beau Fanny, FNP      . buPROPion (WELLBUTRIN XL) 24 hr tablet 300 mg  300 mg Oral Daily Rachael Fee, MD   300 mg at 07/23/15 0834  . divalproex (DEPAKOTE ER) 24 hr tablet 1,000 mg  1,000 mg Oral QHS Rachael Fee, MD      . hydrOXYzine (ATARAX/VISTARIL) tablet 50 mg  50 mg Oral TID PRN Worthy Flank, NP   50 mg at 07/22/15 2209  . magnesium hydroxide (MILK OF MAGNESIA) suspension 30 mL  30 mL Oral Daily PRN Beau Fanny, FNP      . neomycin-bacitracin-polymyxin (NEOSPORIN) ointment  Topical BID Rachael FeeIrving A Livianna Petraglia, MD   1 application at 07/23/15 [phone]  . nicotine (NICODERM CQ - dosed in mg/24 hours) patch 21 mg  21 mg Transdermal Daily Rachael FeeIrving A Emilea Goga, MD   21 mg at 07/23/15 0834  . thiamine (VITAMIN B-1) tablet 100 mg  100 mg Oral Daily Beau FannyJohn C Withrow, FNP   100 mg at 07/23/15 [id number]    Lab Results: No results found for this or any previous visit (from the past 48 hour(s)).  Physical Findings: AIMS: Facial and Oral Movements Muscles of Facial Expression: None, normal Lips and Perioral Area: None, normal Jaw: None, normal Tongue: None, normal,Extremity Movements Upper (arms, wrists, hands, fingers): None, normal Lower (legs, knees, ankles, toes): None, normal, Trunk Movements Neck, shoulders, hips: None, normal, Overall Severity Severity of abnormal movements (highest score from questions above): None, normal Incapacitation due to abnormal movements: None, normal Patient's awareness of abnormal  movements (rate only patient's report): No Awareness, Dental Status Current problems with teeth and/or dentures?: No Does patient usually wear dentures?: No  CIWA:  CIWA-Ar Total: 2 COWS:     Musculoskeletal: Strength & Muscle Tone: within normal limits Gait & Station: normal Patient leans: normal  Psychiatric Specialty Exam: Review of Systems  Constitutional: Negative.   HENT: Negative.   Eyes: Negative.   Respiratory: Negative.   Cardiovascular: Negative.   Gastrointestinal: Negative.   Genitourinary: Negative.   Musculoskeletal: Negative.   Skin: Negative.   Neurological: Negative.   Endo/Heme/Allergies: Negative.   Psychiatric/Behavioral: Positive for depression and substance abuse. The patient is nervous/anxious.     Blood pressure 138/80, pulse 79, temperature 97.8 F (36.6 C), temperature source Oral, resp. rate 14, height 5\' 10"  (1.778 m), weight 105.235 kg (232 lb).Body mass index is 33.29 kg/(m^2).  General Appearance: Fairly Groomed  Patent attorneyye Contact::  Fair  Speech:  Clear and Coherent  Volume:  Normal  Mood:  worried  Affect:  worried  Thought Process:  Coherent and Goal Directed  Orientation:  Full (Time, Place, and Person)  Thought Content:  symptoms events worries concerns  Suicidal Thoughts:  No  Homicidal Thoughts:  No  Memory:  Immediate;   Fair Recent;   Fair Remote;   Fair  Judgement:  Fair  Insight:  Present  Psychomotor Activity:  Restlessness  Concentration:  Fair  Recall:  [employer]  Fund of Knowledge:Fair  Language: Fair  Akathisia:  No  Handed:  Right  AIMS (if indicated):     Assets:  Desire for Improvement  ADL's:  Intact  Cognition: WNL  Sleep:  Number of Hours: 6.25   Treatment Plan Summary: Daily contact with patient to assess and evaluate symptoms and progress in treatment and Medication management Supportive approach/coping skills Polysubstance abuse; will continue working a relapse prevention plan Mood instability; will increase  the Depakote to 1000 mg HS Depression; will continue the Wellbutrin XL 300 mg in AM Use CBT/mndfulness  Nina Hoar A 07/23/2015, 6:26 PM

## 2015-07-24 MED ORDER — HYDROXYZINE HCL 50 MG PO TABS: 50.0000 mg | ORAL_TABLET | Freq: Three times a day (TID) | ORAL | Status: DC | PRN

## 2015-07-24 MED ORDER — NICOTINE 21 MG/24HR TD PT24: 21.0000 mg | MEDICATED_PATCH | Freq: Every day | TRANSDERMAL | Status: DC

## 2015-07-24 MED ORDER — DIVALPROEX SODIUM ER 500 MG PO TB24: 1000.0000 mg | ORAL_TABLET | Freq: Every day | ORAL | Status: DC

## 2015-07-24 MED ORDER — BACITRACIN-NEOMYCIN-POLYMYXIN 400-5-5000 EX OINT: TOPICAL_OINTMENT | Freq: Two times a day (BID) | CUTANEOUS | Status: DC

## 2015-07-24 MED ORDER — BUPROPION HCL ER (XL) 300 MG PO TB24: 300.0000 mg | ORAL_TABLET | Freq: Every day | ORAL | Status: DC

## 2015-07-24 NOTE — Progress Notes (Signed)
D:Per patient self inventory form pt reports he slept good last night with the use of sleep medication. He reports a good appetite, normal energy level, good concentration. He rates depression 3/10, hopelessness 3/10, anxiety 4/10- all on 0-10 scale, 10 being the worse. Pt denies any physical pain. Pt reports his goal for the day is "plan on leaving" and that "focus on leaving" will help meet his goal. Pt behavior calm and cooperative on approach. He voices no complaints at this time. Pleasant during interaction with undersign. Pt did not attend nursing group on the unit.  A:special checks q 15 mins in place for safety. Medication administered per MD order (See eMAR). Encouragement and support provided. Discharge planning in place.  R:Safety maintained. Complaint with medication regimen. Will continue to monitor.

## 2015-07-24 NOTE — [facility] Group Notes (Signed)
[facility] Group Notes:  (Nursing/MHT/Case Management/Adjunct)  Date:  07/24/2015  Time: 0915  Type of Therapy:  Nurse Education  Participation Level:  Did Not Attend   Summary of Progress/Problems:  Cody Osborne Cody Osborne 07/24/2015, 10:29 AM

## 2015-07-24 NOTE — Progress Notes (Signed)
  [facility] :  Will you be returning to the same living situation after discharge:  No. Patient plans to discharge to shelter.  At discharge, do you have transportation home?: Yes,  patient will be provided with a bus pass Do you have the ability to pay for your medications: Yes,  patient will be provided with prescriptions at discharge  Release of information consent forms completed and in the chart;  Patient's signature needed at discharge.  Patient to Follow up at: Follow-up Information    Follow up with [facility].   Why:  Walk-in clinic Monday-Friday 8am to 3pm for assessment for therapy and medication management services. Please go early in the morning and let them know if you are an established patient in order to be seen sooner.    Contact information:   [address] [place], [place] [address] Phone: [phone] Fax: [phone]      Patient denies SI/HI: Yes,  denies    Safety Planning and Suicide Prevention discussed: Yes,  with patient   Have you used any form of tobacco in the last 30 days? (Cigarettes, Smokeless Tobacco, Cigars, and/or Pipes): Yes  Has patient been referred to the Quitline?: Patient refused referral  Emmanuel Gruenhagen, [place] CarboKristin L 07/24/2015, 8:43 AM

## 2015-07-24 NOTE — Progress Notes (Signed)
Discharge note: Pt discharged per MD order. Discharge summary reviewed with pt. Pt verbalizes and signs understanding of discharge summary. Pt denies SI/HI, Denies AVH. Denies physical pain. Rx given, sample medication given, bus pass given. All items returned to pt from locker #36. Pt verbalizes and signs that he received all personal items from locker. Reports he is going to the bus station. Pt ambulatory out of facility.

## 2015-07-24 NOTE — [facility] Group Notes (Signed)
[facility] LCSW Aftercare Discharge Planning Group Note  07/23/2015  8:45 AM  Participation Quality: Did Not Attend. Patient invited to participate but declined.  Makaylen Thieme, MSW, LCSWA Clinical Social Worker [facility] [phone]   

## 2015-07-24 NOTE — Progress Notes (Signed)
D: Patient is alert and oriented x 4. Patient denies pain/SI/HI/AVH. Patient reports he is feeling depressed. Patient reports to Clinical research associatewriter he is hoping he will be discharged tomorrow.  A: Staff to monitor Q 15 mins for safety. Encouragement and support offered. Scheduled medications administered per orders. R: Patient remains safe on the unit. Patient attended group tonight. Patient visible on hte unit and interacting with peers. Patient taking administered medications.

## 2015-07-24 NOTE — [facility] Suicide Risk Assessment (Signed)
[facility] Discharge Suicide Risk Assessment   Demographic Factors:  Male and Caucasian  Total Time spent with patient: 30 minutes  Musculoskeletal: Strength & Muscle Tone: within normal limits Gait & Station: normal Patient leans: normal  Psychiatric Specialty Exam: Physical Exam  Review of Systems  Constitutional: Negative.   HENT: Negative.   Eyes: Negative.   Respiratory: Negative.   Cardiovascular: Negative.   Gastrointestinal: Negative.   Genitourinary: Negative.   Musculoskeletal: Negative.   Skin: Negative.   Neurological: Negative.   Endo/Heme/Allergies: Negative.   Psychiatric/Behavioral: Positive for depression and substance abuse.    Blood pressure 115/75, pulse 82, temperature 98 F (36.7 C), temperature source Oral, resp. rate 14, height  (1.778 m), weight 105.235 kg (232 lb).Body mass index is 33.29 kg/(m^2).  General Appearance: Fairly Groomed  Patent attorney::  Fair  Speech:  Clear and Coherent409  Volume:  Normal  Mood:  worried  Affect:  Appropriate  Thought Process:  Coherent and Goal Directed  Orientation:  Full (Time, Place, and Person)  Thought Content:  symptoms events worries concerns  Suicidal Thoughts:  No  Homicidal Thoughts:  No  Memory:  Immediate;   Fair Recent;   Fair Remote;   Fair  Judgement:  Fair  Insight:  Present  Psychomotor Activity:  Normal  Concentration:  Fair  Recall:  [employer] of Knowledge:Fair  Language: Fair  Akathisia:  No  Handed:  Right  AIMS (if indicated):     Assets:  Desire for Improvement  Sleep:  Number of Hours: 6.25  Cognition: WNL  ADL's:  Intact   Have you used any form of tobacco in the last 30 days? (Cigarettes, Smokeless Tobacco, Cigars, and/or Pipes): Yes  Has this patient used any form of tobacco in the last 30 days? (Cigarettes, Smokeless Tobacco, Cigars, and/or Pipes) Yes, A prescription for an FDA-approved tobacco cessation medication was offered at discharge and the patient  refused  Mental Status Per Nursing Assessment::   On Admission:  Suicidal ideation indicated by patient, Self-harm thoughts  Current Mental Status by Physician: In full contact with reality. There are no active S/S of withdrawal. There are no active Si plans or intent. He is going to the [facility] and work with them towards getting a bed at a [facility]. He is still hoping he can get back together with his GF.   Loss Factors: Loss of significant relationship  Historical Factors: NA  Risk Reduction Factors:   Sense of responsibility to family  Continued Clinical Symptoms:  Bipolar Disorder:   Depressive phase Alcohol/Substance Abuse/Dependencies  Cognitive Features That Contribute To Risk:  Closed-mindedness, Polarized thinking and Thought constriction (tunnel vision)    Suicide Risk:  Minimal: No identifiable suicidal ideation.  Patients presenting with no risk factors but with morbid ruminations; may be classified as minimal risk based on the severity of the depressive symptoms  Principal Problem: Bipolar 1 disorder, mixed, moderate (HCC) Discharge Diagnoses:  Patient Active Problem List   Diagnosis Date Noted  . Cocaine abuse with cocaine-induced mood disorder (HCC) [F14.14] 07/18/2015  . Bipolar 1 disorder, mixed, moderate (HCC) [F31.62] 04/23/2013  . Medically noncompliant [Z91.19] 04/23/2013  . Polysubstance abuse [F19.10] 04/23/2013    Follow-up Information    Follow up with [facility].   Why:  Walk-in clinic Monday-Friday 8am to 3pm for assessment for therapy and medication management services. Please go early in the morning and let them know if you are an established patient in order to be seen sooner.  Contact information:   [address] [place], [place] [address] Phone: [phone] Fax: [phone]      Plan Of Care/Follow-up recommendations:  Activity:  as tolerated Diet:  regular Follow up [facility] as above Is patient on multiple antipsychotic therapies at  discharge:  No   Has Patient had three or more failed trials of antipsychotic monotherapy by history:  No  Recommended Plan for Multiple Antipsychotic Therapies: NA    Jaydenn Boccio A 07/24/2015, 11:02 AM

## 2015-07-24 NOTE — [facility] Group Notes (Signed)
[facility] LCSW Group Therapy 07/23/15  1:15 PM Type of Therapy: Group Therapy Participation Level: Active  Participation Quality: Attentive, Sharing and Supportive  Affect: Appropriate  Cognitive: Alert and Oriented  Insight: Developing/Improving and Engaged  Engagement in Therapy: Developing/Improving and Engaged  Modes of Intervention: Clarification, Confrontation, Discussion, Education, Exploration, Limit-setting, Orientation, Problem-solving, Rapport Building, Dance movement psychotherapisteality Testing, Socialization and Support  Summary of Progress/Problems: The topic for group today was emotional regulation. This group focused on both positive and negative emotion identification and allowed group members to process ways to identify feelings, regulate negative emotions, and find healthy ways to manage internal/external emotions. Group members were asked to reflect on a time when their reaction to an emotion led to a negative outcome and explored how alternative responses using emotion regulation would have benefited them. Group members were also asked to discuss a time when emotion regulation was utilized when a negative emotion was experienced. Patient actively participated in discussion of emotional stressors and healthy coping skills. Patient shared that he often deals with stress by procrastinating and using unhealthy coping skills such as watching too much television instead of looking for employment. CSW and other group members provided patient with emotional support and encouragement.  Samuella BruinKristin Joeangel Jeanpaul, MSW, [employer]CSWA Clinical Social Worker [facility] [phone]
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
## 2019-10-22 ENCOUNTER — Encounter (HOSPITAL_COMMUNITY): Payer: Self-pay | Admitting: Psychiatry

## 2019-10-22 ENCOUNTER — Inpatient Hospital Stay (HOSPITAL_COMMUNITY)
Admission: AD | Admit: 2019-10-22 | Discharge: 2019-10-25 | DRG: 885 | Disposition: A | Discharge status: Home / Self Care | Payer: Medicaid Other | Source: Intra-hospital | Attending: Psychiatry | Admitting: Psychiatry

## 2019-10-22 ENCOUNTER — Other Ambulatory Visit: Payer: Self-pay | Admitting: Behavioral Health

## 2019-10-22 ENCOUNTER — Other Ambulatory Visit: Payer: Self-pay

## 2019-10-22 DIAGNOSIS — F1994 Other psychoactive substance use, unspecified with psychoactive substance-induced mood disorder: Secondary | ICD-10-CM | POA: Diagnosis not present

## 2019-10-22 DIAGNOSIS — F1010 Alcohol abuse, uncomplicated: Secondary | ICD-10-CM | POA: Diagnosis present

## 2019-10-22 DIAGNOSIS — F1123 Opioid dependence with withdrawal: Secondary | ICD-10-CM

## 2019-10-22 DIAGNOSIS — F1520 Other stimulant dependence, uncomplicated: Secondary | ICD-10-CM | POA: Diagnosis present

## 2019-10-22 DIAGNOSIS — Z79899 Other long term (current) drug therapy: Secondary | ICD-10-CM | POA: Diagnosis not present

## 2019-10-22 DIAGNOSIS — F319 Bipolar disorder, unspecified: Secondary | ICD-10-CM | POA: Diagnosis present

## 2019-10-22 DIAGNOSIS — Z20822 Contact with and (suspected) exposure to covid-19: Secondary | ICD-10-CM | POA: Diagnosis present

## 2019-10-22 DIAGNOSIS — F1124 Opioid dependence with opioid-induced mood disorder: Secondary | ICD-10-CM | POA: Diagnosis present

## 2019-10-22 DIAGNOSIS — F17210 Nicotine dependence, cigarettes, uncomplicated: Secondary | ICD-10-CM | POA: Diagnosis present

## 2019-10-22 DIAGNOSIS — R45851 Suicidal ideations: Secondary | ICD-10-CM | POA: Diagnosis present

## 2019-10-22 MED ORDER — ONDANSETRON 4 MG PO TBDP
4.0000 mg | ORAL_TABLET | Freq: Four times a day (QID) | ORAL | Status: DC | PRN
  Administered 2019-10-23 (×2): 4 mg via ORAL
  Filled 2019-10-22 (×2): qty 1

## 2019-10-22 MED ORDER — NAPROXEN 500 MG PO TABS
500.0000 mg | ORAL_TABLET | Freq: Two times a day (BID) | ORAL | Status: DC | PRN
  Administered 2019-10-23: 500 mg via ORAL
  Filled 2019-10-22: qty 1

## 2019-10-22 MED ORDER — DICYCLOMINE HCL 20 MG PO TABS: 20.0000 mg | ORAL_TABLET | Freq: Four times a day (QID) | ORAL | Status: DC | PRN

## 2019-10-22 MED ORDER — ENSURE ENLIVE PO LIQD
237.0000 mL | Freq: Two times a day (BID) | ORAL | Status: DC
  Administered 2019-10-23 – 2019-10-24 (×4): 237 mL via ORAL

## 2019-10-22 MED ORDER — CLONIDINE HCL 0.1 MG PO TABS
0.1000 mg | ORAL_TABLET | Freq: Four times a day (QID) | ORAL | Status: AC
  Administered 2019-10-22 – 2019-10-24 (×8): 0.1 mg via ORAL
  Filled 2019-10-22 (×11): qty 1

## 2019-10-22 MED ORDER — TRAZODONE HCL 50 MG PO TABS
50.0000 mg | ORAL_TABLET | Freq: Every evening | ORAL | Status: DC | PRN
  Administered 2019-10-22: 50 mg via ORAL
  Filled 2019-10-22 (×4): qty 1

## 2019-10-22 MED ORDER — DIVALPROEX SODIUM ER 500 MG PO TB24
500.0000 mg | ORAL_TABLET | Freq: Every day | ORAL | Status: DC
  Administered 2019-10-22 – 2019-10-24 (×3): 500 mg via ORAL
  Filled 2019-10-22 (×5): qty 1

## 2019-10-22 MED ORDER — HYDROXYZINE HCL 25 MG PO TABS
25.0000 mg | ORAL_TABLET | Freq: Four times a day (QID) | ORAL | Status: DC | PRN
  Administered 2019-10-23 – 2019-10-24 (×3): 25 mg via ORAL
  Filled 2019-10-22 (×4): qty 1

## 2019-10-22 MED ORDER — METHOCARBAMOL 500 MG PO TABS
500.0000 mg | ORAL_TABLET | Freq: Three times a day (TID) | ORAL | Status: DC | PRN
  Administered 2019-10-23: 500 mg via ORAL
  Filled 2019-10-22: qty 1

## 2019-10-22 MED ORDER — HYDROXYZINE HCL 25 MG PO TABS: 25.0000 mg | ORAL_TABLET | Freq: Four times a day (QID) | ORAL | Status: DC | PRN

## 2019-10-22 MED ORDER — NAPROXEN 500 MG PO TABS: 500.0000 mg | ORAL_TABLET | Freq: Two times a day (BID) | ORAL | Status: DC | PRN

## 2019-10-22 MED ORDER — METHOCARBAMOL 500 MG PO TABS: 500.0000 mg | ORAL_TABLET | Freq: Three times a day (TID) | ORAL | Status: DC | PRN

## 2019-10-22 MED ORDER — LOPERAMIDE HCL 2 MG PO CAPS: 2.0000 mg | ORAL_CAPSULE | ORAL | Status: DC | PRN

## 2019-10-22 MED ORDER — CLONIDINE HCL 0.1 MG PO TABS
0.1000 mg | ORAL_TABLET | ORAL | Status: DC
  Administered 2019-10-24: 0.1 mg via ORAL
  Filled 2019-10-22 (×4): qty 1

## 2019-10-22 MED ORDER — ONDANSETRON 4 MG PO TBDP: 4.0000 mg | ORAL_TABLET | Freq: Four times a day (QID) | ORAL | Status: DC | PRN

## 2019-10-22 MED ORDER — LORAZEPAM 1 MG PO TABS
1.0000 mg | ORAL_TABLET | Freq: Once | ORAL | Status: AC
  Administered 2019-10-22: 1 mg via ORAL
  Filled 2019-10-22: qty 1

## 2019-10-22 MED ORDER — CLONIDINE HCL 0.1 MG PO TABS: 0.1000 mg | ORAL_TABLET | Freq: Every day | ORAL | Status: DC

## 2019-10-22 MED ORDER — NICOTINE 21 MG/24HR TD PT24
21.0000 mg | MEDICATED_PATCH | Freq: Every day | TRANSDERMAL | Status: DC
  Administered 2019-10-22 – 2019-10-25 (×4): 21 mg via TRANSDERMAL
  Filled 2019-10-22 (×5): qty 1
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
## 2019-10-22 NOTE — [facility]HH Suicide Risk Assessment (Signed)
[facility] Admission Suicide Risk Assessment   Nursing information obtained from:  Patient Demographic factors:  Male, Low socioeconomic status, Unemployed, Caucasian Current Mental Status:  Suicidal ideation indicated by patient, Self-harm thoughts Loss Factors:  Decrease in vocational status, Loss of significant relationship, Financial problems / change in socioeconomic status Historical Factors:  Impulsivity, Family history of suicide, Family history of mental illness or substance abuse Risk Reduction Factors:  NA  Total Time spent with patient: 45 minutes Principal Problem: Opiate Dependence, Methamphetamine Abuse, Substance Induced Mood Disorder versus Bipolar Disorder Depressed  Diagnosis: Opiate Dependence, Methamphetamine Abuse, Substance Induced Mood Disorder versus Bipolar Disorder Depressed  Subjective Data:   Continued Clinical Symptoms:  Alcohol Use Disorder Identification Test Final Score (AUDIT): 0 The "Alcohol Use Disorders Identification Test", Guidelines for Use in Primary Care, Second Edition.  World Science writer [facility]). Score between 0-7:  no or low risk or alcohol related problems. Score between 8-15:  moderate risk of alcohol related problems. Score between 16-19:  high risk of alcohol related problems. Score 20 or above:  warrants further diagnostic evaluation for alcohol dependence and treatment.   CLINICAL FACTORS:  49 year old male, presented to [facility] ED reporting worsening depression, suicidal ideations with thought of overdosing, neuro-vegetative symptoms of depression. He reports opiate ( heroin) dependence and has been using daily. He also endorses intermittent methamphetamine use . History of past Bipolar Disorder diagnosis and patient describes history of depression and possible hypomanic episodes even when abstinent. Has not been taking any psychiatric medications over recent weeks. Reports history of good response and tolerance to Depakote in the past  .    Psychiatric Specialty Exam: Physical Exam  Review of Systems  Blood pressure 116/73, pulse 96, temperature 99.1 F (37.3 C), temperature source Oral, resp. rate 18, height 5\' 10"  (1.778 m), weight 77.1 kg, SpO2 100 %.Body mass index is 24.39 kg/m.  See admit note MSE                                                        COGNITIVE FEATURES THAT CONTRIBUTE TO RISK:  Closed-mindedness and Loss of executive function    SUICIDE RISK:   Moderate:  Frequent suicidal ideation with limited intensity, and duration, some specificity in terms of plans, no associated intent, good self-control, limited dysphoria/symptomatology, some risk factors present, and identifiable protective factors, including available and accessible social support.  PLAN OF CARE: Patient will be admitted to inpatient psychiatric unit for stabilization and safety. Will provide and encourage milieu participation. Provide medication management and maked adjustments as needed.  Will also provide medication management for opiate WDL as needed.Will follow daily.    I certify that inpatient services furnished can reasonably be expected to improve the patient's condition.   , MD 10/22/2019, 6:32 PM
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
## 2019-10-22 NOTE — Progress Notes (Signed)
   10/22/19 2300  Clinical Opiate Withdrawal Scale (COWS)  Resting Pulse Rate 1  Sweating 1  Restlessness 3  Pupil Size 0  Bone or Joint Aches 0  Runny Nose or Tearing 0  GI Upset 0  Tremor 0  Yawning 0  Anxiety or Irritability 2  Gooseflesh Skin 0  COWS Total Score 7   Pt calling out down the hall. Pt states "I'm having heroin withdrawal." Pt restless, agitated beyond what was seen previously in dayroom. Provider notified. Pt given trazodone 50 mg and Ativan 1 mg. Will continue to monitor.

## 2019-10-22 NOTE — Progress Notes (Signed)
   10/22/19 2102  Psych Admission Type (Psych Patients Only)  Admission Status Voluntary  Psychosocial Assessment  Patient Complaints None  Eye Contact Brief  Facial Expression Anxious;Animated  Affect Anxious  Speech Press photographer  Appearance/Hygiene Unremarkable  Behavior Characteristics Cooperative  Mood Pleasant  Thought Process  Coherency WDL  Content WDL  Delusions None reported or observed  Perception WDL  Hallucination None reported or observed  Judgment Impaired  Confusion None  Danger to Self  Current suicidal ideation? Denies  Agreement Not to Harm Self Yes  Description of Agreement verbal  Danger to Others  Danger to Others None reported or observed   Pt in dayroom watching television. Fidgety d/t opiate withdrawal. Pt denies SI, HI, AVH and pain. Contracts for safety.

## 2019-10-22 NOTE — Progress Notes (Signed)
The patient mentioned that he might be discharged in the next day or so. He also states that he needs to access his bank card in order to pay one of his bills.

## 2019-10-22 NOTE — Tx Team (Signed)
Initial Treatment Plan 10/22/2019 5:18 PM Tavious Griesinger PQZ:[id number]    PATIENT STRESSORS: Financial difficulties Health problems Medication change or noncompliance Substance abuse   PATIENT STRENGTHS: Capable of independent living Communication skills   PATIENT IDENTIFIED PROBLEMS: Polysubstance Abuse  Suicidal ideation  Ineffective coping skills                 DISCHARGE CRITERIA:  Motivation to continue treatment in a less acute level of care Verbal commitment to aftercare and medication compliance  PRELIMINARY DISCHARGE PLAN: Outpatient therapy Placement in alternative living arrangements  PATIENT/FAMILY INVOLVEMENT: This treatment plan has been presented to and reviewed with the patient, Bader Stubblefield, and/or family member.  The patient and family have been given the opportunity to ask questions and make suggestions.  Clarene Critchley, RN 10/22/2019, 5:18 PM

## 2019-10-23 LAB — TSH
TSH: 0.18 u[IU]/mL — ABNORMAL LOW (ref 0.350–4.500)
TSH: 0.09 u[IU]/mL — ABNORMAL LOW (ref 0.350–4.500)

## 2019-10-23 LAB — HEMOGLOBIN A1C
Hgb A1c MFr Bld: 5.3 % (ref 4.8–5.6)
Mean Plasma Glucose: 105.41 mg/dL

## 2019-10-23 LAB — T4, FREE: Free T4: 1.08 ng/dL (ref 0.61–1.12)

## 2019-10-23 LAB — LIPID PANEL
Cholesterol: 199 mg/dL (ref 0–200)
HDL: 62 mg/dL (ref >40)
LDL Cholesterol: 122 mg/dL — ABNORMAL HIGH (ref 0–99)
Total CHOL/HDL Ratio: 3.2 RATIO
Triglycerides: 73 mg/dL (ref <150)
VLDL: 15 mg/dL (ref 0–40)

## 2019-10-23 MED ORDER — TRAZODONE HCL 100 MG PO TABS
100.0000 mg | ORAL_TABLET | Freq: Every evening | ORAL | Status: DC | PRN
  Administered 2019-10-23 – 2019-10-24 (×3): 100 mg via ORAL
  Filled 2019-10-23 (×7): qty 1

## 2019-10-23 NOTE — Progress Notes (Signed)
   10/23/19 2252  Psych Admission Type (Psych Patients Only)  Admission Status Voluntary  Psychosocial Assessment  Patient Complaints Anxiety (d/t withdrawal symptoms)  Eye Contact Fair  Facial Expression Anxious;Animated  Affect Anxious  Speech Press photographer  Appearance/Hygiene Unremarkable  Behavior Characteristics Cooperative;Anxious  Mood Pleasant;Anxious  Thought Process  Coherency WDL  Content WDL  Delusions None reported or observed  Perception WDL  Hallucination None reported or observed  Judgment Impaired  Confusion None  Danger to Self  Current suicidal ideation? Denies  Agreement Not to Harm Self Yes  Description of Agreement verbal  Danger to Others  Danger to Others None reported or observed   Pt denies SI, HI, AVH and pain. Says he has slept off and on most of the day. Still experiencing opiate withdrawal symptoms. Pt contracts for safety.

## 2019-10-23 NOTE — Progress Notes (Signed)
D:  Patient denied SI and HI, contracts for safety.  Denied A/V hallucinations.   A:  Medications administered per MD orders.  Emotional support and encouragement given patient. R:  Safety maintained with 15 minute checks.  
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
## 2019-10-23 NOTE — Progress Notes (Signed)
Patient wishes to pursue residential substance use treatment.  CSW faxed referrals to [facility] and [facility] on behalf of the patient.  CSW following for disposition.  Enid Cutter, LCSW-A Clinical Social Worker
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
## 2019-10-23 NOTE — Plan of Care (Signed)
Nurse discussed anxiety, depression and coping skills with patient.  

## 2019-10-23 NOTE — Progress Notes (Signed)
Pt had very offensive language on the phone and was asked by two staff to keep it under control or phone restrictions will be put into effect.

## 2019-10-23 NOTE — Progress Notes (Signed)
NUTRITION ASSESSMENT  Pt identified as at risk on the Malnutrition Screen Tool  INTERVENTION: 1. Supplements: Ensure Enlive po BID, each supplement provides 350 kcal and 20 grams of protein  NUTRITION DIAGNOSIS: Unintentional weight loss related to sub-optimal intake as evidenced by pt report.   Goal: Pt to meet >/= 90% of their estimated nutrition needs.  Monitor:  PO intake  Assessment:  Pt admitted for opiate use disorder. Pt currently withdrawing from heroin. Pt also reports use of meth as well. Pt reports poor appetite. Per weight history of care everywhere, pt weighed 224 lbs on 07/10/18 -last recorded weight in chart PTA. Pt has lost 54 lbs since then, that is a 24% wt loss x 15 months.  Ensure supplements have been ordered.   Height: Ht Readings from Last 1 Encounters:  10/22/19 5\' 10"  (1.778 m)    Weight: Wt Readings from Last 1 Encounters:  10/22/19 77.1 kg    Weight Hx: Wt Readings from Last 10 Encounters:  10/22/19 77.1 kg  07/18/15 105.2 kg  05/30/13 103.1 kg  04/23/13 103.9 kg    BMI:  Body mass index is 24.39 kg/m. Pt meets criteria for normal based on current BMI.  Estimated Nutritional Needs: Kcal: 25-30 kcal/kg Protein: > 1 gram protein/kg Fluid: 1 ml/kcal  Diet Order:  Diet Order            Diet regular Room service appropriate? No; Fluid consistency: Thin; Fluid restriction: 2000 mL Fluid  Diet effective now             Pt is also offered choice of unit snacks mid-morning and mid-afternoon.  Pt is eating as desired.   Lab results and medications reviewed.   04/25/13, MS, RD, LDN Inpatient Clinical Dietitian Pager: [phone] After Hours Pager: [phone]
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
## 2019-10-24 LAB — T3, FREE: T3, Free: 3.3 pg/mL (ref 2.0–4.4)

## 2019-10-24 NOTE — BHH Counselor (Signed)
Patient requested referrals initially for residential substance use treatment and referrals to [facility] and [facility] were faxed on 01/12. Patient now declines these referrals and states his friend will pick him up and drive him to a shelter. Patient declines outpatient follow up as he is unsure of where he will be living.  Enid Cutter, MSW, LCSW-A Clinical Social Worker [facility]  [phone]

## 2019-10-24 NOTE — Progress Notes (Signed)
Patient did not attend the evening speaker AA meeting. Pt was notified that group was beginning but declined.

## 2019-10-24 NOTE — Plan of Care (Addendum)
Progress note  D: pt found in bed; compliant with medication admnistration. Pt states they slept poorly from the withdrawal symptoms. Pt has complaints of agitation, sweats, tremors, and anxiety. Pt rates their depression/hopelessness/anxiety a 4/4/4 out of 10 respectively. Pt failed to fill out their self inventory. Pt is pleasant but reclusive to their room. Pt has been resting most of the day. Pt denies si/hi/ah/vh and verbally agrees to approach staff if these become apparent or before harming themself/others while at bhh.  A: Pt provided support and encouragement. Pt given medication per protocol and standing orders. Q6m safety checks implemented and continued.  R: Pt safe on the unit. Will continue to monitor.  Pt progressing in the following metrics  Problem: Education: Goal: Knowledge of [facility] Education information/materials will improve Outcome: Progressing Goal: Emotional status will improve Outcome: Progressing Goal: Mental status will improve Outcome: Progressing Goal: Verbalization of understanding the information provided will improve Outcome: Progressing

## 2019-10-24 NOTE — Progress Notes (Signed)
[facility] MD Progress Note  10/24/2019 12:18 PM Cody Osborne  MRN:  [id number] Subjective:  Patient is a 49 year old male with a past psychiatric history significant for bipolar disorder, opiate dependence, methamphetamine dependence who presented to the [facility] emergency department on 10/22/2019 with suicidal ideation.  He was transferred to our facility for evaluation and stabilization.  Objective: Patient is seen and examined.  Patient is a 49 year old male with the above-stated past psychiatric history who is seen in follow-up.  Patient stated that he is decided not to go to [facility].  He stated he is already been through their program x1, and he does not believe he will get a whole lot more out of it.  He stated his current plan is to go stay with a friend.  This is not the friend who uses heroin.  We discussed this at length, and the fact that I do not think this is a great idea.  We discussed going to the [employer], but he stated he had already been there as well.  He did not want to return there.  He stated that his plan was to get his friend to take him back to his other dwelling, get his stuff, and then go to the homeless shelter in [place] or somewhere else.  I asked him to work on his plan to be more clear, so I would feel more comfortable with his discharge.  He stated he would work on it over the next 24 hours.  He denied any suicidal or homicidal ideation.  He denied any side effects to current medications.  His vital signs are stable, he is afebrile.  He slept 6.75 hours last night.  Principal Problem: <principal problem not specified> Diagnosis: Active Problems:   Bipolar 1 disorder (HCC)  Total Time spent with patient: 20 minutes  Past Psychiatric History: See admission H&P  Past Medical History:  Past Medical History:  Diagnosis Date  . Alcohol abuse   . Bipolar 1 disorder ([place])   . Depression   . Substance abuse ([place])    History reviewed. No pertinent surgical  history. Family History:  Family History  Problem Relation Age of Onset  . Heart attack Father   . Heart attack Brother    Family Psychiatric  History: See admission H&P Social History:  Social History   Substance and Sexual Activity  Alcohol Use No   Comment: former      Social History   Substance and Sexual Activity  Drug Use No  . Types: Marijuana, Methamphetamines   Comment: former    Social History   Socioeconomic History  . Marital status: Single    Spouse name: Not on file  . Number of children: Not on file  . Years of education: Not on file  . Highest education level: Not on file  Occupational History  . Not on file  Tobacco Use  . Smoking status: Current Some Day Smoker    Packs/day: 0.50    Years: 8.00    Pack years: 4.00    Types: Cigarettes  . Smokeless tobacco: Never Used  Substance and Sexual Activity  . Alcohol use: No    Comment: former   . Drug use: No    Types: Marijuana, Methamphetamines    Comment: former  . Sexual activity: Never    Birth control/protection: None  Other Topics Concern  . Not on file  Social History Narrative  . Not on file   Social Determinants of Health  Financial Resource Strain:   . Difficulty of Paying Living Expenses: Not on file  Food Insecurity:   . Worried About Programme researcher, broadcasting/film/videounning Out of Food in the Last Year: Not on file  . Ran Out of Food in the Last Year: Not on file  Transportation Needs:   . Lack of Transportation (Medical): Not on file  . Lack of Transportation (Non-Medical): Not on file  Physical Activity:   . Days of Exercise per Week: Not on file  . Minutes of Exercise per Session: Not on file  Stress:   . Feeling of Stress : Not on file  Social Connections:   . Frequency of Communication with Friends and Family: Not on file  . Frequency of Social Gatherings with Friends and Family: Not on file  . Attends Religious Services: Not on file  . Active Member of Clubs or Organizations: Not on file  .  Attends BankerClub or Organization Meetings: Not on file  . Marital Status: Not on file   Additional Social History:                         Sleep: Good  Appetite:  Good  Current Medications: Current Facility-Administered Medications  Medication Dose Route Frequency Provider Last Rate Last Admin  . cloNIDine (CATAPRES) tablet 0.1 mg  0.1 mg Oral BH-qamhs Cobos, Rockey SituFernando A, MD       Followed by  . [START ON 10/27/2019] cloNIDine (CATAPRES) tablet 0.1 mg  0.1 mg Oral QAC breakfast Cobos, Rockey SituFernando A, MD      . dicyclomine (BENTYL) tablet 20 mg  20 mg Oral Q6H PRN Cobos, Rockey SituFernando A, MD      . divalproex (DEPAKOTE ER) 24 hr tablet 500 mg  500 mg Oral QHS Cobos, Rockey SituFernando A, MD   500 mg at 10/23/19 2109  . feeding supplement (ENSURE ENLIVE) (ENSURE ENLIVE) liquid 237 mL  237 mL Oral BID BM Cobos, Fernando A, MD   237 mL at 10/24/19 1037  . hydrOXYzine (ATARAX/VISTARIL) tablet 25 mg  25 mg Oral Q6H PRN Cobos, Rockey SituFernando A, MD   25 mg at 10/23/19 1541  . loperamide (IMODIUM) capsule 2-4 mg  2-4 mg Oral PRN Cobos, Rockey SituFernando A, MD      . methocarbamol (ROBAXIN) tablet 500 mg  500 mg Oral Q8H PRN Cobos, Rockey SituFernando A, MD   500 mg at 10/23/19 0829  . naproxen (NAPROSYN) tablet 500 mg  500 mg Oral BID PRN Cobos, Rockey SituFernando A, MD   500 mg at 10/23/19 0828  . nicotine (NICODERM CQ - dosed in mg/24 hours) patch 21 mg  21 mg Transdermal Daily Cobos, Rockey SituFernando A, MD   21 mg at 10/24/19 0819  . ondansetron (ZOFRAN-ODT) disintegrating tablet 4 mg  4 mg Oral Q6H PRN Cobos, Rockey SituFernando A, MD   4 mg at 10/23/19 1541  . traZODone (DESYREL) tablet 100 mg  100 mg Oral QHS,MR X 1 Antonieta Pertlary, Andrae Claunch Lawson, MD   100 mg at 10/23/19 2110    Lab Results:  Results for orders placed or performed during the hospital encounter of 10/22/19 (from the past 48 hour(s))  Hemoglobin A1c     Status: None   Collection Time: 10/23/19  6:10 AM  Result Value Ref Range   Hgb A1c MFr Bld 5.3 4.8 - 5.6 %    Comment: (NOTE) Pre diabetes:           5.7%-6.4% Diabetes:              >  6.4% Glycemic control for   <7.0% adults with diabetes    Mean Plasma Glucose 105.41 mg/dL    Comment: Performed at [facility], [address] [place], [place] [address]  Lipid panel     Status: Abnormal   Collection Time: 10/23/19  6:10 AM  Result Value Ref Range   Cholesterol 199 0 - 200 mg/dL   Triglycerides 73 <660 mg/dL   HDL 62 >63 mg/dL   Total CHOL/HDL Ratio 3.2 RATIO   VLDL 15 0 - 40 mg/dL   LDL Cholesterol 016 (H) 0 - 99 mg/dL    Comment:        Total Cholesterol/HDL:CHD Risk Coronary Heart Disease Risk Table                     Men   Women  1/2 Average Risk   3.4   3.3  Average Risk       5.0   4.4  2 X Average Risk   9.6   7.1  3 X Average Risk  23.4   11.0        Use the calculated Patient Ratio above and the CHD Risk Table to determine the patient's CHD Risk.        ATP III CLASSIFICATION (LDL):  <100     mg/dL   Optimal  010-932  mg/dL   Near or Above                    Optimal  130-159  mg/dL   Borderline  355-732  mg/dL   High  >202     mg/dL   Very High Performed at [facility], [address], [place], [place] [address]   TSH     Status: Abnormal   Collection Time: 10/23/19  6:10 AM  Result Value Ref Range   TSH 0.180 (L) 0.350 - 4.500 uIU/mL    Comment: Performed by a 3rd Generation assay with a functional sensitivity of <=0.01 uIU/mL. Performed at [facility], [address], [place], [place] [address]   T3, free     Status: None   Collection Time: 10/23/19  6:24 PM  Result Value Ref Range   T3, Free 3.3 2.0 - 4.4 pg/mL    Comment: (NOTE) Performed At: [facility] [address] [place], [place] [id number] Jolene Schimke MD YW:[id number]   T4, free     Status: None   Collection Time: 10/23/19  6:24 PM  Result Value Ref Range   Free T4 1.08 0.61 - 1.12 ng/dL    Comment: (NOTE) Biotin ingestion may interfere with free T4 tests. If the results  are inconsistent with the TSH level, previous test results, or the clinical presentation, then consider biotin interference. If needed, order repeat testing after stopping biotin. Performed at [facility], [address], [place], [place] [address]   TSH     Status: Abnormal   Collection Time: 10/23/19  6:24 PM  Result Value Ref Range   TSH 0.090 (L) 0.350 - 4.500 uIU/mL    Comment: Performed by a 3rd Generation assay with a functional sensitivity of <=0.01 uIU/mL. Performed at [facility], [address], [place], [place] [address]     Blood Alcohol level:  Lab Results  Component Value Date   [facility] <5 07/17/2015   ETH <11 05/30/2013    Metabolic Disorder Labs: Lab Results  Component Value Date  HGBA1C 5.3 10/23/2019   MPG 105.41 10/23/2019   MPG 105 07/19/2015   No results found for: PROLACTIN Lab Results  Component Value Date   CHOL 199 10/23/2019   TRIG 73 10/23/2019   HDL 62 10/23/2019   CHOLHDL 3.2 10/23/2019   VLDL 15 10/23/2019   LDLCALC 122 (H) 10/23/2019   LDLCALC 153 (H) 07/19/2015    Physical Findings: AIMS: Facial and Oral Movements Muscles of Facial Expression: None, normal Lips and Perioral Area: None, normal Jaw: None, normal Tongue: None, normal,Extremity Movements Upper (arms, wrists, hands, fingers): None, normal Lower (legs, knees, ankles, toes): None, normal, Trunk Movements Neck, shoulders, hips: None, normal, Overall Severity Severity of abnormal movements (highest score from questions above): None, normal Incapacitation due to abnormal movements: None, normal Patient's awareness of abnormal movements (rate only patient's report): No Awareness, Dental Status Current problems with teeth and/or dentures?: No Does patient usually wear dentures?: No  CIWA:  CIWA-Ar Total: 1 COWS:  COWS Total Score: 2  Musculoskeletal: Strength & Muscle Tone: within normal limits Gait & Station: normal Patient leans:  N/A  Psychiatric Specialty Exam: Physical Exam  Nursing note and vitals reviewed. Constitutional: He is oriented to person, place, and time. He appears well-developed and well-nourished.  HENT:  Head: Normocephalic and atraumatic.  Respiratory: Effort normal.  Neurological: He is alert and oriented to person, place, and time.    Review of Systems  Blood pressure 128/71, pulse 79, temperature 97.7 F (36.5 C), resp. rate 16, height 5\' 10"  (1.778 m), weight 77.1 kg, SpO2 100 %.Body mass index is 24.39 kg/m.  General Appearance: Casual  Eye Contact:  Fair  Speech:  Normal Rate  Volume:  Normal  Mood:  Anxious  Affect:  Congruent  Thought Process:  Coherent and Descriptions of Associations: Circumstantial  Orientation:  Full (Time, Place, and Person)  Thought Content:  Logical  Suicidal Thoughts:  No  Homicidal Thoughts:  No  Memory:  Immediate;   Fair Recent;   Fair Remote;   Fair  Judgement:  Fair  Insight:  Lacking  Psychomotor Activity:  Increased  Concentration:  Concentration: Fair and Attention Span: Fair  Recall:  of Knowledge:  Fair  Language:  Good  Akathisia:  Negative  Handed:  Right  AIMS (if indicated):     Assets:  Desire for Improvement Resilience  ADL's:  Intact  Cognition:  WNL  Sleep:  Number of Hours: 6.75     Treatment Plan Summary: Daily contact with patient to assess and evaluate symptoms and progress in treatment, Medication management and Plan : Patient is seen and examined.  Patient is a 49 year old male with the above-stated past psychiatric history who is seen in follow-up.   Diagnosis: #1 opiate dependence, #2 amphetamine dependence, #3 reported history of bipolar disorder  Patient is seen in follow-up.  He is doing fine, but again I have discussed with him at length about his plan.  He will work on clarifying some of these issues today.  If everything goes well over the next 24 hours we will plan discharge tomorrow.  I will  obtain a CBC with differential, liver function enzymes and a Depakote level in the a.m.  Clearly it is only been a couple of days, but at least we can make sure about his liver function and CBC.  No other changes in his medications at this point.  His thyroid function studies have come back, and his repeat TSH was 0.090.  His free  T3 and T4 were both within normal limits, but we will ask him to contact the primary care provider for follow-up on this. 1.  Continue Depakote ER 500 mg p.o. nightly for mood stability. 2.  Continue opiate detox protocol. 3.  Continue trazodone 100 mg p.o. nightly as needed insomnia. 4.  CBC with differential, liver function enzymes and a Depakote level in a.m. tomorrow. 5.  Disposition planning-in progress.  Antonieta Pert, MD 10/24/2019, 12:18 PM

## 2019-10-24 NOTE — Tx Team (Signed)
Interdisciplinary Treatment and Diagnostic Plan Update  10/24/2019 Time of Session: 9:00am Cody Osborne MRN: [id number]  Principal Diagnosis: <principal problem not specified>  Secondary Diagnoses: Active Problems:   Bipolar 1 disorder (HCC)   Current Medications:  Current Facility-Administered Medications  Medication Dose Route Frequency Provider Last Rate Last Admin  . cloNIDine (CATAPRES) tablet 0.1 mg  0.1 mg Oral QID Cobos, Myer Peer, MD   0.1 mg at 10/24/19 1017   Followed by  . cloNIDine (CATAPRES) tablet 0.1 mg  0.1 mg Oral BH-qamhs Cobos, Myer Peer, MD       Followed by  . [START ON 10/27/2019] cloNIDine (CATAPRES) tablet 0.1 mg  0.1 mg Oral QAC breakfast Cobos, Myer Peer, MD      . dicyclomine (BENTYL) tablet 20 mg  20 mg Oral Q6H PRN Cobos, Myer Peer, MD      . divalproex (DEPAKOTE ER) 24 hr tablet 500 mg  500 mg Oral QHS Cobos, Myer Peer, MD   500 mg at 10/23/19 2109  . feeding supplement (ENSURE ENLIVE) (ENSURE ENLIVE) liquid 237 mL  237 mL Oral BID BM Cobos, Myer Peer, MD   237 mL at 10/23/19 1538  . hydrOXYzine (ATARAX/VISTARIL) tablet 25 mg  25 mg Oral Q6H PRN Cobos, Myer Peer, MD   25 mg at 10/23/19 1541  . loperamide (IMODIUM) capsule 2-4 mg  2-4 mg Oral PRN Cobos, Myer Peer, MD      . methocarbamol (ROBAXIN) tablet 500 mg  500 mg Oral Q8H PRN Cobos, Myer Peer, MD   500 mg at 10/23/19 0829  . naproxen (NAPROSYN) tablet 500 mg  500 mg Oral BID PRN Cobos, Myer Peer, MD   500 mg at 10/23/19 0828  . nicotine (NICODERM CQ - dosed in mg/24 hours) patch 21 mg  21 mg Transdermal Daily Cobos, Myer Peer, MD   21 mg at 10/24/19 0819  . ondansetron (ZOFRAN-ODT) disintegrating tablet 4 mg  4 mg Oral Q6H PRN Cobos, Myer Peer, MD   4 mg at 10/23/19 1541  . traZODone (DESYREL) tablet 100 mg  100 mg Oral QHS,MR X 1 [name], MD   100 mg at 10/23/19 2110   PTA Medications: Medications Prior to Admission  Medication Sig Dispense Refill Last Dose  . buPROPion  (WELLBUTRIN XL) 300 MG 24 hr tablet Take 1 tablet (300 mg total) by mouth daily. (Patient not taking: Reported on 10/23/2019) 30 tablet 0 Not Taking at Unknown time  . divalproex (DEPAKOTE ER) 500 MG 24 hr tablet Take 2 tablets (1,000 mg total) by mouth at bedtime. (Patient not taking: Reported on 10/23/2019) 60 tablet 0 Not Taking at Unknown time  . hydrOXYzine (ATARAX/VISTARIL) 50 MG tablet Take 1 tablet (50 mg total) by mouth 3 (three) times daily as needed for anxiety. (Patient not taking: Reported on 10/23/2019) 30 tablet 0 Not Taking at Unknown time  . neomycin-bacitracin-polymyxin (NEOSPORIN) ointment Apply topically 2 (two) times daily. apply to eye (Patient not taking: Reported on 10/23/2019) 15 g 0 Not Taking at Unknown time  . nicotine (NICODERM CQ - DOSED IN MG/24 HOURS) 21 mg/24hr patch Place 1 patch (21 mg total) onto the skin daily. (Patient not taking: Reported on 10/23/2019) 28 patch 0 Not Taking at Unknown time    Patient Stressors: Financial difficulties Health problems Medication change or noncompliance Substance abuse  Patient Strengths: Capable of independent living Communication skills  Treatment Modalities: Medication Management, Group therapy, Case management,  1 to 1 session with clinician, Psychoeducation, Recreational  therapy.   Physician Treatment Plan for Primary Diagnosis: <principal problem not specified> Long Term Goal(s): Improvement in symptoms so as ready for discharge Improvement in symptoms so as ready for discharge   Short Term Goals: Ability to identify changes in lifestyle to reduce recurrence of condition will improve Ability to verbalize feelings will improve Ability to disclose and discuss suicidal ideas Ability to demonstrate self-control will improve Ability to identify and develop effective coping behaviors will improve Ability to maintain clinical measurements within normal limits will improve Compliance with prescribed medications will  improve Ability to identify triggers associated with substance abuse/mental health issues will improve Ability to identify changes in lifestyle to reduce recurrence of condition will improve Ability to verbalize feelings will improve Ability to disclose and discuss suicidal ideas Ability to demonstrate self-control will improve Ability to identify and develop effective coping behaviors will improve Ability to maintain clinical measurements within normal limits will improve Compliance with prescribed medications will improve Ability to identify triggers associated with substance abuse/mental health issues will improve  Medication Management: Evaluate patient's response, side effects, and tolerance of medication regimen.  Therapeutic Interventions: 1 to 1 sessions, Unit Group sessions and Medication administration.  Evaluation of Outcomes: Progressing  Physician Treatment Plan for Secondary Diagnosis: Active Problems:   Bipolar 1 disorder (HCC)  Long Term Goal(s): Improvement in symptoms so as ready for discharge Improvement in symptoms so as ready for discharge   Short Term Goals: Ability to identify changes in lifestyle to reduce recurrence of condition will improve Ability to verbalize feelings will improve Ability to disclose and discuss suicidal ideas Ability to demonstrate self-control will improve Ability to identify and develop effective coping behaviors will improve Ability to maintain clinical measurements within normal limits will improve Compliance with prescribed medications will improve Ability to identify triggers associated with substance abuse/mental health issues will improve Ability to identify changes in lifestyle to reduce recurrence of condition will improve Ability to verbalize feelings will improve Ability to disclose and discuss suicidal ideas Ability to demonstrate self-control will improve Ability to identify and develop effective coping behaviors will  improve Ability to maintain clinical measurements within normal limits will improve Compliance with prescribed medications will improve Ability to identify triggers associated with substance abuse/mental health issues will improve     Medication Management: Evaluate patient's response, side effects, and tolerance of medication regimen.  Therapeutic Interventions: 1 to 1 sessions, Unit Group sessions and Medication administration.  Evaluation of Outcomes: Progressing   RN Treatment Plan for Primary Diagnosis: <principal problem not specified> Long Term Goal(s): Knowledge of disease and therapeutic regimen to maintain health will improve  Short Term Goals: Ability to participate in decision making will improve, Ability to identify and develop effective coping behaviors will improve and Compliance with prescribed medications will improve  Medication Management: RN will administer medications as ordered by provider, will assess and evaluate patient's response and provide education to patient for prescribed medication. RN will report any adverse and/or side effects to prescribing provider.  Therapeutic Interventions: 1 on 1 counseling sessions, Psychoeducation, Medication administration, Evaluate responses to treatment, Monitor vital signs and CBGs as ordered, Perform/monitor CIWA, COWS, AIMS and Fall Risk screenings as ordered, Perform wound care treatments as ordered.  Evaluation of Outcomes: Progressing   LCSW Treatment Plan for Primary Diagnosis: <principal problem not specified> Long Term Goal(s): Safe transition to appropriate next level of care at discharge, Engage patient in therapeutic group addressing interpersonal concerns.  Short Term Goals: Engage patient in aftercare planning  with referrals and resources, Increase social support, Increase emotional regulation, Identify triggers associated with mental health/substance abuse issues and Increase skills for wellness and  recovery  Therapeutic Interventions: Assess for all discharge needs, 1 to 1 time with Social worker, Explore available resources and support systems, Assess for adequacy in community support network, Educate family and significant other(s) on suicide prevention, Complete Psychosocial Assessment, Interpersonal group therapy.  Evaluation of Outcomes: Progressing  Progress in Treatment: Attending groups: No. Participating in groups: No. Taking medication as prescribed: Yes. Toleration medication: Yes. Family/Significant other contact made: Yes, individual(s) contacted:  mother. Patient understands diagnosis: Yes. Discussing patient identified problems/goals with staff: Yes. Medical problems stabilized or resolved: Yes. Denies suicidal/homicidal ideation: Yes. Issues/concerns per patient self-inventory: Yes.  New problem(s) identified: Yes, Describe:  no income, homelessness, limited support network.  New Short Term/Long Term Goal(s): detox, medication management for mood stabilization; elimination of SI thoughts; development of comprehensive mental wellness/sobriety plan.  Patient Goals: Wanted to detox and get re-established with medications.  Discharge Plan or Barriers: Patient requested referrals initially for residential substance use treatment and referrals to [facility] and [facility] were faxed on 01/12. Patient now declines these referrals and states his friend will pick him up and drive him to a shelter. Patient declines outpatient follow up.  Reason for Continuation of Hospitalization: Anxiety Withdrawal symptoms  Estimated Length of Stay: 1-2 days  Attendees: Patient: Cody Osborne 10/24/2019 10:04 AM  Physician: Marguerita Merles 10/24/2019 10:04 AM  Nursing: Casimiro Needle, RN 10/24/2019 10:04 AM  RN Care Manager: 10/24/2019 10:04 AM  Social Worker: Enid Cutter, LCSWA 10/24/2019 10:04 AM  Recreational Therapist:  10/24/2019 10:04 AM  Other: Marciano Sequin, NP 10/24/2019 10:04 AM  Other:  10/24/2019 10:04  AM  Other: 10/24/2019 10:04 AM    Scribe for Treatment Team: Darreld Mclean, LCSWA 10/24/2019 10:04 AM

## 2019-10-24 NOTE — Progress Notes (Signed)
Recreation Therapy Notes  Date:  1.13.21 Time: 0930 Location: [address] Dayroom  Group Topic: Stress Management  Goal Area(s) Addresses:  Patient will identify positive stress management techniques. Patient will identify benefits of using stress management post d/c.  Intervention: Stress Management  Activity :  Guided Imagery.  LRT read a script that had patients visualize being outside looking at a starry sky at night.  Patients were to listen as the script was read to engage in the activity.  Education:  Stress Management, Discharge Planning.   Education Outcome: Acknowledges Education  Clinical Observations/Feedback: Pt did not attend activity.    Caprisha Bridgett, LRT/CTRS         Kamiya Acord A 10/24/2019 11:08 AM 

## 2019-10-24 NOTE — [facility] Group Notes (Signed)
Type of Therapy and Topic: Group Therapy: Core Beliefs  Participation Level: Did Not Attend  Description of Group: In this group patients will be encouraged to explore their negative and positive core beliefs about themselves, others, and the world. Each patient will be challenged to identify these beliefs and ways to challenge negative core beliefs. This group will be process-oriented, with patients participating in exploration of their own experiences as well as giving and receiving support and challenge from other group members.  Therapeutic Goals: 1. Patient will identify personal core beliefs, both negative and positive. 2. Patient will identify core beliefs relating to others, both negative and positive. 3. Patient will challenge their negative beliefs about themselves and others. 4. Patient will identify three changes they can make to replace negative core beliefs with positive beliefs.  Summary of Patient Progress  Due to the COVID-19 pandemic, this group has been supplemented with worksheets.   Eames Dibiasio, MSW, LCSW Clinical Social Worker [facility]  Phone: [phone]  

## 2019-10-24 NOTE — Progress Notes (Signed)
   10/24/19 2227  Psych Admission Type (Psych Patients Only)  Admission Status Voluntary  Psychosocial Assessment  Patient Complaints Anxiety  Eye Contact Fair  Facial Expression Animated;Anxious  Affect Anxious;Appropriate to circumstance  Speech Logical/coherent  Interaction Assertive  Motor Activity Fidgety  Appearance/Hygiene Unremarkable  Behavior Characteristics Cooperative  Mood Anxious  Thought Process  Coherency WDL  Content WDL  Delusions None reported or observed  Perception WDL  Hallucination None reported or observed  Judgment Poor  Confusion None  Danger to Self  Current suicidal ideation? Denies  Self-Injurious Behavior No self-injurious ideation or behavior indicators observed or expressed   Agreement Not to Harm Self Yes  Description of Agreement verbal  Danger to Others  Danger to Others None reported or observed  D: Patient in mostly in his room. Pt reports increase anxiety and irritability. Pt denies pain.  A: Medications administered as prescribed. Support and encouragement provided as needed.  R: Patient remains safe on the unit. Will continue to monitor for safety and stability.

## 2019-10-24 NOTE — Progress Notes (Signed)
Adult Psychoeducational Group Note  Date:  10/24/2019 Time:  11:08 AM  Group Topic/Focus:  Making Healthy Choices:   The focus of this group is to help patients identify negative/unhealthy choices they were using prior to admission and identify positive/healthier coping strategies to replace them upon discharge.  Participation Level:  Active  Participation Quality:  Appropriate  Affect:  Appropriate  Cognitive:  Alert  Insight: Appropriate  Engagement in Group:  Engaged  Modes of Intervention:  Discussion and Education  Additional Comments:    Pt attended group. Today's topic is personal development. MHT discussed how to stay S.T.R.O.N.G (sleep, treat, resist, once a day, nutrition). Group discussed how to make positive choices and choosing healthy coping skills.  Pt shared his experiences with unsupportive family and friends. Pt discussed the effects these people had on him.    Karren Cobble 10/24/2019, 11:08 AM

## 2019-10-25 DIAGNOSIS — F1520 Other stimulant dependence, uncomplicated: Secondary | ICD-10-CM

## 2019-10-25 DIAGNOSIS — F319 Bipolar disorder, unspecified: Principal | ICD-10-CM

## 2019-10-25 DIAGNOSIS — F1124 Opioid dependence with opioid-induced mood disorder: Secondary | ICD-10-CM

## 2019-10-25 LAB — CBC WITH DIFFERENTIAL/PLATELET
Abs Immature Granulocytes: 0.05 10*3/uL (ref 0.00–0.07)
Basophils Absolute: 0.1 10*3/uL (ref 0.0–0.1)
Basophils Relative: 0 %
Eosinophils Absolute: 0 10*3/uL (ref 0.0–0.5)
Eosinophils Relative: 0 %
HCT: 48.5 % (ref 39.0–52.0)
Hemoglobin: 15.8 g/dL (ref 13.0–17.0)
Immature Granulocytes: 0 %
Lymphocytes Relative: 26 %
Lymphs Abs: 3.4 10*3/uL (ref 0.7–4.0)
MCH: 29.6 pg (ref 26.0–34.0)
MCHC: 32.6 g/dL (ref 30.0–36.0)
MCV: 90.8 fL (ref 80.0–100.0)
Monocytes Absolute: 0.7 10*3/uL (ref 0.1–1.0)
Monocytes Relative: 6 %
Neutro Abs: 8.8 10*3/uL — ABNORMAL HIGH (ref 1.7–7.7)
Neutrophils Relative %: 68 %
Platelets: 292 10*3/uL (ref 150–400)
RBC: 5.34 MIL/uL (ref 4.22–5.81)
RDW: 13.7 % (ref 11.5–15.5)
WBC: 13 10*3/uL — ABNORMAL HIGH (ref 4.0–10.5)
nRBC: 0 % (ref 0.0–0.2)

## 2019-10-25 LAB — HEPATIC FUNCTION PANEL
ALT: 16 U/L (ref 0–44)
AST: 15 U/L (ref 15–41)
Albumin: 4 g/dL (ref 3.5–5.0)
Alkaline Phosphatase: 57 U/L (ref 38–126)
Bilirubin, Direct: 0.1 mg/dL (ref 0.0–0.2)
Indirect Bilirubin: 0.5 mg/dL (ref 0.3–0.9)
Total Bilirubin: 0.6 mg/dL (ref 0.3–1.2)
Total Protein: 7.1 g/dL (ref 6.5–8.1)

## 2019-10-25 LAB — RESPIRATORY PANEL BY RT PCR (FLU A&B, COVID)
Influenza A by PCR: NEGATIVE
Influenza B by PCR: NEGATIVE
SARS Coronavirus 2 by RT PCR: NEGATIVE

## 2019-10-25 LAB — VALPROIC ACID LEVEL: Valproic Acid Lvl: 25 ug/mL — ABNORMAL LOW (ref 50.0–100.0)

## 2019-10-25 MED ORDER — AZITHROMYCIN 250 MG PO TABS: ORAL_TABLET | ORAL | 0 refills | Status: AC

## 2019-10-25 MED ORDER — AZITHROMYCIN 500 MG PO TABS
500.0000 mg | ORAL_TABLET | Freq: Every day | ORAL | Status: AC
  Administered 2019-10-25: 500 mg via ORAL
  Filled 2019-10-25: qty 2
  Filled 2019-10-25: qty 1

## 2019-10-25 MED ORDER — TRAZODONE HCL 100 MG PO TABS: 100.0000 mg | ORAL_TABLET | Freq: Every evening | ORAL | 0 refills | Status: AC | PRN

## 2019-10-25 MED ORDER — NICOTINE 21 MG/24HR TD PT24: 21.0000 mg | MEDICATED_PATCH | Freq: Every day | TRANSDERMAL | 0 refills | Status: AC

## 2019-10-25 MED ORDER — TRAZODONE HCL 100 MG PO TABS
100.0000 mg | ORAL_TABLET | Freq: Every evening | ORAL | Status: DC | PRN
  Filled 2019-10-25: qty 7

## 2019-10-25 MED ORDER — DIVALPROEX SODIUM ER 500 MG PO TB24: 500.0000 mg | ORAL_TABLET | Freq: Every day | ORAL | 0 refills | Status: AC

## 2019-10-25 MED ORDER — ACETAMINOPHEN 325 MG PO TABS
650.0000 mg | ORAL_TABLET | Freq: Four times a day (QID) | ORAL | Status: DC | PRN
  Administered 2019-10-25 (×2): 650 mg via ORAL
  Filled 2019-10-25 (×2): qty 2

## 2019-10-25 MED ORDER — AZITHROMYCIN 250 MG PO TABS
250.0000 mg | ORAL_TABLET | Freq: Every day | ORAL | Status: DC
  Filled 2019-10-25: qty 1

## 2019-10-25 MED ORDER — HYDROXYZINE HCL 25 MG PO TABS: 25.0000 mg | ORAL_TABLET | Freq: Four times a day (QID) | ORAL | 0 refills | Status: AC | PRN

## 2019-10-25 NOTE — Progress Notes (Signed)
D:  Patient denied SI and HI, contracts for safety.  Denied A/V hallucinations.   A:  Medications administered per MD orders.  Emotional support and encouragement given patient. R:  Safety maintained with 15 minute checks.  

## 2019-10-25 NOTE — Progress Notes (Signed)
Patient ID: Cody Osborne, male   DOB: 11-Apr-1971, 49 y.o.   MRN: [id number] Pt had an elevated temp (99.7). MHT reports pt coughing with running nose. Pt appears irritable c/o unable to fall asleep. NP notified new order of tylenol and repeat covid-19 test done. Pt last covid-19 negative result was 10/22/2019 at [facility].

## 2019-10-25 NOTE — [facility] Suicide Risk Assessment (Signed)
[facility] Discharge Suicide Risk Assessment   Principal Problem: <principal problem not specified> Discharge Diagnoses: Active Problems:   Bipolar 1 disorder (HCC)   Total Time spent with patient: 15 minutes  Musculoskeletal: Strength & Muscle Tone: within normal limits Gait & Station: normal Patient leans: N/A  Psychiatric Specialty Exam: Review of Systems  All other systems reviewed and are negative.   Blood pressure 130/75, pulse (!) 109, temperature 99.7 F (37.6 C), resp. rate 16, height 5\' 10"  (1.778 m), weight 77.1 kg, SpO2 99 %.Body mass index is 24.39 kg/m.  General Appearance: Casual  Eye Contact::  Fair  Speech:  Normal Rate409  Volume:  Normal  Mood:  Anxious  Affect:  Congruent  Thought Process:  Coherent and Descriptions of Associations: Intact  Orientation:  Full (Time, Place, and Person)  Thought Content:  Logical  Suicidal Thoughts:  No  Homicidal Thoughts:  No  Memory:  Immediate;   Fair Recent;   Fair Remote;   Fair  Judgement:  Intact  Insight:  Fair  Psychomotor Activity:  Increased  Concentration:  Fair  Recall:  [id number] of Knowledge:Poor  Language: Good  Akathisia:  Negative  Handed:  Right  AIMS (if indicated):     Assets:  Desire for Improvement Resilience  Sleep:  Number of Hours: 3.5  Cognition: WNL  ADL's:  Intact   Mental Status Per Nursing Assessment::   On Admission:  Suicidal ideation indicated by patient, Self-harm thoughts  Demographic Factors:  Male, Caucasian, Low socioeconomic status, Living alone and Unemployed  Loss Factors: NA  Historical Factors: Impulsivity  Risk Reduction Factors:   NA  Continued Clinical Symptoms:  Depression:   Comorbid alcohol abuse/dependence Impulsivity Alcohol/Substance Abuse/Dependencies  Cognitive Features That Contribute To Risk:  None    Suicide Risk:  Minimal: No identifiable suicidal ideation.  Patients presenting with no risk factors but with morbid ruminations; may be  classified as minimal risk based on the severity of the depressive symptoms  Follow-up Information    Services, [facility]. Call.   Why: If you are interested in establishing services for medication management and/or therapy services after you discharge, please call agency within 3-5 days of discharge. Be sure to provide any discharge paperwork from this hospitalization.  Contact information: [address] [place] [phone]           Plan Of Care/Follow-up recommendations:  Activity:  ad lib  [phone], MD 10/25/2019, 7:41 AM

## 2019-10-25 NOTE — Discharge Instructions (Signed)
Patient declined any appointments at this time. Please be sure to contact agency listed in "Follow-Up Providers" if you are interested in any outpatient psychiatric services.
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
## 2019-10-25 NOTE — Progress Notes (Signed)
Discharge note  Patient verbalizes readiness for discharge. Follow up plan explained, AVS, Transition record and SRA given. Prescriptions and teaching provided. Belongings returned and signed for. Suicide safety plan completed and signed. Patient verbalizes understanding. Patient denies SI/HI and assures this Clinical research associate he will seek assistance should that change. Patient discharged to lobby where friend was waiting.

## 2019-10-25 NOTE — Progress Notes (Signed)
  [facility] Adult Case Management Discharge Plan :  Will you be returning to the same living situation after discharge:  Yes,  patient reports he is discharging to a friend's home At discharge, do you have transportation home?: Yes,  patient reports his close friend is picking him up Do you have the ability to pay for your medications: Yes,  Medicaid  Release of information consent forms completed and in the chart;  Patient's signature needed at discharge.  Patient to Follow up at: Follow-up Information    [facility], [facility]. Call.   Why: If you are interested in establishing services for medication management and/or therapy services after you discharge, please call agency within 3-5 days of discharge. Be sure to provide any discharge paperwork from this hospitalization.  Contact information: [address] [place] [address] [phone]           Next level of care provider has access to [facility] Link:yes  Safety Planning and Suicide Prevention discussed: Yes,  with the patient's mother  Have you used any form of tobacco in the last 30 days? (Cigarettes, Smokeless Tobacco, Cigars, and/or Pipes): Yes  Has patient been referred to the Quitline?: Patient refused referral  Patient has been referred for addiction treatment: Pt. refused referral  Maeola Sarah, LCSWA 10/25/2019, 9:26 AM
